# Patient Record
Sex: Female | Born: 1980
Health system: Southern US, Community
[De-identification: ages and names within clinical notes are randomized; demographics above are authoritative.]

## PROBLEM LIST (undated history)

## (undated) DIAGNOSIS — M1990 Unspecified osteoarthritis, unspecified site: Secondary | ICD-10-CM

## (undated) DIAGNOSIS — K219 Gastro-esophageal reflux disease without esophagitis: Secondary | ICD-10-CM

## (undated) DIAGNOSIS — K649 Unspecified hemorrhoids: Secondary | ICD-10-CM

## (undated) DIAGNOSIS — J301 Allergic rhinitis due to pollen: Secondary | ICD-10-CM

## (undated) DIAGNOSIS — T7840XA Allergy, unspecified, initial encounter: Secondary | ICD-10-CM

## (undated) DIAGNOSIS — Z87440 Personal history of urinary (tract) infections: Secondary | ICD-10-CM

## (undated) DIAGNOSIS — Z8669 Personal history of other diseases of the nervous system and sense organs: Secondary | ICD-10-CM

## (undated) DIAGNOSIS — R197 Diarrhea, unspecified: Secondary | ICD-10-CM

## (undated) DIAGNOSIS — K921 Melena: Secondary | ICD-10-CM

## (undated) DIAGNOSIS — K5900 Constipation, unspecified: Secondary | ICD-10-CM

## (undated) DIAGNOSIS — R87619 Unspecified abnormal cytological findings in specimens from cervix uteri: Secondary | ICD-10-CM

## (undated) HISTORY — DX: Unspecified osteoarthritis, unspecified site: M19.90

## (undated) HISTORY — PX: LYMPHADENECTOMY: SHX15

## (undated) HISTORY — DX: Constipation, unspecified: K59.00

## (undated) HISTORY — DX: Personal history of urinary (tract) infections: Z87.440

## (undated) HISTORY — DX: Unspecified hemorrhoids: K64.9

## (undated) HISTORY — DX: Melena: K92.1

## (undated) HISTORY — PX: FRACTURE SURGERY: SHX138

## (undated) HISTORY — DX: Allergy, unspecified, initial encounter: T78.40XA

## (undated) HISTORY — DX: Diarrhea, unspecified: R19.7

## (undated) HISTORY — DX: Personal history of other diseases of the nervous system and sense organs: Z86.69

## (undated) HISTORY — DX: Unspecified abnormal cytological findings in specimens from cervix uteri: R87.619

## (undated) HISTORY — DX: Gastro-esophageal reflux disease without esophagitis: K21.9

## (undated) HISTORY — DX: Allergic rhinitis due to pollen: J30.1

---

## 2013-10-22 DIAGNOSIS — M4180 Other forms of scoliosis, site unspecified: Secondary | ICD-10-CM

## 2013-10-22 HISTORY — DX: Other forms of scoliosis, site unspecified: M41.80

## 2014-10-22 DIAGNOSIS — M069 Rheumatoid arthritis, unspecified: Secondary | ICD-10-CM

## 2014-10-22 HISTORY — DX: Rheumatoid arthritis, unspecified: M06.9

## 2015-10-23 DIAGNOSIS — M5136 Other intervertebral disc degeneration, lumbar region: Secondary | ICD-10-CM

## 2015-10-23 DIAGNOSIS — M51369 Other intervertebral disc degeneration, lumbar region without mention of lumbar back pain or lower extremity pain: Secondary | ICD-10-CM

## 2015-10-23 HISTORY — DX: Other intervertebral disc degeneration, lumbar region without mention of lumbar back pain or lower extremity pain: M51.369

## 2015-10-23 HISTORY — DX: Other intervertebral disc degeneration, lumbar region: M51.36

## 2016-02-06 HISTORY — PX: OTHER SURGICAL HISTORY: SHX169

## 2020-10-07 ENCOUNTER — Telehealth: Payer: Self-pay | Admitting: Physician Assistant

## 2020-10-07 ENCOUNTER — Encounter: Payer: Self-pay | Admitting: Physician Assistant

## 2020-10-07 NOTE — Telephone Encounter (Signed)
Sent letter to patient telling her that appointment will need to be rescheduled due to provider not being in.

## 2020-10-26 ENCOUNTER — Ambulatory Visit: Payer: Self-pay | Admitting: Physician Assistant

## 2020-10-31 ENCOUNTER — Other Ambulatory Visit: Payer: Self-pay

## 2020-10-31 ENCOUNTER — Encounter: Payer: Self-pay | Admitting: Physician Assistant

## 2020-10-31 ENCOUNTER — Ambulatory Visit (INDEPENDENT_AMBULATORY_CARE_PROVIDER_SITE_OTHER): Payer: BC Managed Care – PPO | Admitting: Physician Assistant

## 2020-10-31 VITALS — BP 110/70 | HR 78 | Temp 98.2°F | Resp 16 | Ht 65.0 in | Wt 153.0 lb

## 2020-10-31 DIAGNOSIS — K219 Gastro-esophageal reflux disease without esophagitis: Secondary | ICD-10-CM | POA: Diagnosis not present

## 2020-10-31 DIAGNOSIS — R635 Abnormal weight gain: Secondary | ICD-10-CM

## 2020-10-31 DIAGNOSIS — M7711 Lateral epicondylitis, right elbow: Secondary | ICD-10-CM

## 2020-10-31 MED ORDER — OMEPRAZOLE 20 MG PO CPDR: 20.0000 mg | DELAYED_RELEASE_CAPSULE | Freq: Every day | ORAL | 3 refills | Status: DC

## 2020-10-31 NOTE — Progress Notes (Signed)
Patient presents to clinic today to establish care.  Acute Concerns: R elbow pain x 1+ years. Denies any noted trauma or injury but did work for the [employer], having to carry heavy items often. Describes pain as aching and sometimes throbbing. Notes there is a spot she can touch that intensifies the pain. Occasionally will make her hand and wrist tingle or go numb. .   Reflux -- daily heartburn, worse in the afternoon. Notes this has been occurring for a while.  Denies any noted major changes in diet but states she probably has eating out more than usual due to move and COVID. No nighttime awakenings. Denies cough. Notes occasional feeling of things getting stuck for a second. Has history of colonoscopy and EGD for reflux symptoms and stool changes. Notes having some fluctuation in stool from loose to harder. Denies melena, hematochezia or tenesmus. .   Weight -- mom, aunt and first cousing with hypothyroidism per patient report. Notes weight gain over the past few years, noting she is the biggest she has ever been. Does note bigger intake of food in the past several months but this   Chronic Issues: Rheumatoid Arthritis -- Diagnosed 6-7 years ago. Was previously on Methotrexate PO for 6-9 months without any improvement and noted side effects. No other options tried.  Will note occasional pain in her joints. Tries to keep active.    Past Medical History:  Diagnosis Date  . Arthritis    Rheumatoid    Past Surgical History:  Procedure Laterality Date  . CESAREAN SECTION     X2  . FRACTURE SURGERY      Current Outpatient Medications on File Prior to Visit  Medication Sig Dispense Refill  . Ascorbic Acid (VITAMIN C WITH ROSE HIPS) 500 MG tablet Take 500 mg by mouth daily.    . Cholecalciferol (VITAMIN D) 50 MCG (2000 UT) CAPS Take 1 capsule by mouth daily.    Marland Kitchen levonorgestrel (MIRENA) 20 MCG/24HR IUD 1 each by Intrauterine route once.     No current facility-administered  medications on file prior to visit.    Allergies  Allergen Reactions  . Bactrim Ds [Sulfamethoxazole-Trimethoprim] Anaphylaxis and Rash    Family History  Problem Relation Age of Onset  . Rheum arthritis Mother   . Fibromyalgia Mother   . Arthritis Father   . Drug abuse Maternal Grandmother   . Heart attack Maternal Grandmother     Social History   Socioeconomic History  . Marital status: Single    Spouse name: Not on file  . Number of children: Not on file  . Years of education: Not on file  . Highest education level: Not on file  Occupational History  . Not on file  Tobacco Use  . Smoking status: Former Games developer  . Smokeless tobacco: Never Used  Vaping Use  . Vaping Use: Never used  Substance and Sexual Activity  . Alcohol use: Yes    Alcohol/week: 6.0 standard drinks    Types: 6 Cans of beer per week  . Drug use: Never  . Sexual activity: Yes    Birth control/protection: I.U.D.  Other Topics Concern  . Not on file  Social History Narrative  . Not on file   Social Determinants of Health   Financial Resource Strain: Not on file  Food Insecurity: Not on file  Transportation Needs: Not on file  Physical Activity: Not on file  Stress: Not on file  Social Connections: Not on file  Intimate  Partner Violence: Not on file   ROS Pertinent ROS are listed in the HPI.   Resp 16   Ht 5\' 5"  (1.651 m)   Wt 153 lb (69.4 kg)   BMI 25.46 kg/m   Physical Exam  Assessment/Plan: 1. Gastroesophageal reflux disease without esophagitis Will check labs today. Dietary recommendations reviewed with patient. Handout given. Start trial of Prilosec 20 mg daily. Follow-up discussed.  - Comprehensive metabolic panel - omeprazole (PRILOSEC) 20 MG capsule; Take 1 capsule (20 mg total) by mouth daily.  Dispense: 30 capsule; Refill: 3  2. Weight gain Will check CMP and TSH level today to make sure non-contributory. Dietary and exercise recommendations reviewed.  - Comprehensive  metabolic panel - TSH  3. Lateral epicondylitis of right elbow Wants to avoid oral NSAIDs if possible due to GERD. Will have her start OTC turmeric and topical Voltaren. Elevation, ice and bracing discussed. If no substantial improvement will refer to Sports Medicine.  This visit occurred during the SARS-CoV-2 public health emergency.  Safety protocols were in place, including screening questions prior to the visit, additional usage of staff PPE, and extensive cleaning of exam room while observing appropriate contact time as indicated for disinfecting solutions.     , PA-C

## 2020-10-31 NOTE — Patient Instructions (Signed)
Please go to the lab today for blood work.  I will call you with your results. We will alter treatment regimen(s) if indicated by your results.   For reflux, please start the dietary recommendations below.  Take the Omeprazole once daily for 2 weeks. Let's follow-up in 2 weeks for reassessment.  Sooner if needed. Our next step will be to set up a GI evaluation to make sure there is no esophageal stricture.   For the elbow, ok to take an OTC Turmeric. Apply topical OTC Voltaren gel to the area.  Elevate while resting. If no substantial improvement we need to consider imaging versus Sports Medicine assessment.   I am getting you set up with a new Rheumatologist.   For weight, we are checking thyroid levels today. I do want you to look into working on low-impact exercise like yoga, tai chi, swimming or water aerobics.  I will call with results and next steps.    Food Choices for Gastroesophageal Reflux Disease, Adult When you have gastroesophageal reflux disease (GERD), the foods you eat and your eating habits are very important. Choosing the right foods can help ease your discomfort. Think about working with a food expert (dietitian) to help you make good choices. What are tips for following this plan? Reading food labels  Look for foods that are low in saturated fat. Foods that may help with your symptoms include: ? Foods that have less than 5% of daily value (DV) of fat. ? Foods that have 0 grams of trans fat. Cooking  Do not fry your food.  Cook your food by baking, steaming, grilling, or broiling. These are all methods that do not need a lot of fat for cooking.  To add flavor, try to use herbs that are low in spice and acidity. Meal planning  Choose healthy foods that are low in fat, such as: ? Fruits and vegetables. ? Whole grains. ? Low-fat dairy products. ? Lean meats, fish, and poultry.  Eat small meals often instead of eating 3 large meals each day. Eat your meals  slowly in a place where you are relaxed. Avoid bending over or lying down until 2-3 hours after eating.  Limit high-fat foods such as fatty meats or fried foods.  Limit your intake of fatty foods, such as oils, butter, and shortening.  Avoid the following as told by your doctor: ? Foods that cause symptoms. These may be different for different people. Keep a food diary to keep track of foods that cause symptoms. ? Alcohol. ? Drinking a lot of liquid with meals. ? Eating meals during the 2-3 hours before bed.   Lifestyle  Stay at a healthy weight. Ask your doctor what weight is healthy for you. If you need to lose weight, work with your doctor to do so safely.  Exercise for at least 30 minutes on 5 or more days each week, or as told by your doctor.  Wear loose-fitting clothes.  Do not smoke or use any products that contain nicotine or tobacco. If you need help quitting, ask your doctor.  Sleep with the head of your bed higher than your feet. Use a wedge under the mattress or blocks under the bed frame to raise the head of the bed.  Chew sugar-free gum after meals. What foods should eat? Eat a healthy, well-balanced diet of fruits, vegetables, whole grains, low-fat dairy products, lean meats, fish, and poultry. Each person is different. Foods that may cause symptoms in one person may  not cause any symptoms in another person. Work with your doctor to find foods that are safe for you. The items listed above may not be a complete list of what you can eat and drink. Contact a food expert for more options.   What foods should I avoid? Limiting some of these foods may help in managing the symptoms of GERD. Everyone is different. Talk with a food expert or your doctor to help you find the exact foods to avoid, if any. Fruits Any fruits prepared with added fat. Any fruits that cause symptoms. For some people, this may include citrus fruits, such as oranges, grapefruit, pineapple, and  lemons. Vegetables Deep-fried vegetables. [place] fries. Any vegetables prepared with added fat. Any vegetables that cause symptoms. For some people, this may include tomatoes and tomato products, chili peppers, onions and garlic, and horseradish. Grains Pastries or quick breads with added fat. Meats and other proteins High-fat meats, such as fatty beef or pork, hot dogs, ribs, ham, sausage, salami, and bacon. Fried meat or protein, including fried fish and fried chicken. Nuts and nut butters, in large amounts. Dairy Whole milk and chocolate milk. Sour cream. Cream. Ice cream. Cream cheese. Milkshakes. Fats and oils Butter. Margarine. Shortening. Ghee. Beverages Coffee and tea, with or without caffeine. Carbonated beverages. Sodas. Energy drinks. Fruit juice made with acidic fruits, such as orange or grapefruit. Tomato juice. Alcoholic drinks. Sweets and desserts Chocolate and cocoa. Donuts. Seasonings and condiments Pepper. Peppermint and spearmint. Added salt. Any condiments, herbs, or seasonings that cause symptoms. For some people, this may include curry, hot sauce, or vinegar-based salad dressings. The items listed above may not be a complete list of what you should not eat and drink. Contact a food expert for more options. Questions to ask your doctor Diet and lifestyle changes are often the first steps that are taken to manage symptoms of GERD. If diet and lifestyle changes do not help, talk with your doctor about taking medicines. Where to find more information  International Foundation for Gastrointestinal Disorders: aboutgerd.org Summary  When you have GERD, food and lifestyle choices are very important in easing your symptoms.  Eat small meals often instead of 3 large meals a day. Eat your meals slowly and in a place where you are relaxed.  Avoid bending over or lying down until 2-3 hours after eating.  Limit high-fat foods such as fatty meats or fried foods. This  information is not intended to replace advice given to you by your health care provider. Make sure you discuss any questions you have with your health care provider. Document Revised: 04/18/2020 Document Reviewed: 04/18/2020 Elsevier Patient Education  [place].

## 2020-11-01 LAB — TSH: TSH: 1.77 u[IU]/mL (ref 0.35–4.50)

## 2020-11-01 LAB — COMPREHENSIVE METABOLIC PANEL
ALT: 17 U/L (ref 0–35)
AST: 17 U/L (ref 0–37)
Albumin: 4.6 g/dL (ref 3.5–5.2)
Alkaline Phosphatase: 52 U/L (ref 39–117)
BUN: 10 mg/dL (ref 6–23)
CO2: 26 mEq/L (ref 19–32)
Calcium: 8.9 mg/dL (ref 8.4–10.5)
Chloride: 103 mEq/L (ref 96–112)
Creatinine, Ser: 0.67 mg/dL (ref 0.40–1.20)
GFR: 108.29 mL/min (ref >60.00)
Glucose, Bld: 80 mg/dL (ref 70–99)
Potassium: 3.7 mEq/L (ref 3.5–5.1)
Sodium: 134 mEq/L — ABNORMAL LOW (ref 135–145)
Total Bilirubin: 0.3 mg/dL (ref 0.2–1.2)
Total Protein: 8 g/dL (ref 6.0–8.3)

## 2020-11-02 ENCOUNTER — Other Ambulatory Visit: Payer: Self-pay | Admitting: Emergency Medicine

## 2020-11-02 DIAGNOSIS — Z8739 Personal history of other diseases of the musculoskeletal system and connective tissue: Secondary | ICD-10-CM

## 2020-12-07 ENCOUNTER — Encounter: Payer: Self-pay | Admitting: Physician Assistant

## 2021-02-10 ENCOUNTER — Other Ambulatory Visit: Payer: Self-pay

## 2021-02-13 ENCOUNTER — Ambulatory Visit: Payer: BC Managed Care – PPO | Admitting: Family Medicine

## 2021-02-13 ENCOUNTER — Other Ambulatory Visit: Payer: Self-pay

## 2021-02-13 ENCOUNTER — Encounter: Payer: Self-pay | Admitting: Family Medicine

## 2021-02-13 VITALS — BP 111/70 | HR 73 | Temp 98.5°F | Ht 64.5 in | Wt 159.0 lb

## 2021-02-13 DIAGNOSIS — M069 Rheumatoid arthritis, unspecified: Secondary | ICD-10-CM

## 2021-02-13 DIAGNOSIS — N921 Excessive and frequent menstruation with irregular cycle: Secondary | ICD-10-CM | POA: Diagnosis not present

## 2021-02-13 DIAGNOSIS — Z0000 Encounter for general adult medical examination without abnormal findings: Secondary | ICD-10-CM

## 2021-02-13 DIAGNOSIS — J302 Other seasonal allergic rhinitis: Secondary | ICD-10-CM

## 2021-02-13 MED ORDER — FLUTICASONE PROPIONATE 50 MCG/ACT NA SUSP: 2.0000 | Freq: Every day | NASAL | 6 refills | Status: DC

## 2021-02-13 MED ORDER — CETIRIZINE HCL 10 MG PO TABS: 10.0000 mg | ORAL_TABLET | Freq: Every day | ORAL | 1 refills | Status: DC

## 2021-02-13 MED ORDER — DICLOFENAC SODIUM 75 MG PO TBEC: 75.0000 mg | DELAYED_RELEASE_TABLET | Freq: Two times a day (BID) | ORAL | 0 refills | Status: DC

## 2021-02-13 NOTE — Patient Instructions (Signed)
  Great to see you today.    If labs were collected, we will inform you of lab results once received either by echart message or telephone call.   - echart message- for normal results that have been seen by the patient already.   - telephone call: abnormal results or if patient has not viewed results in their echart.  

## 2021-02-13 NOTE — Progress Notes (Signed)
Patient ID: Crystal Hutchinson, female  DOB: 1981/06/15, 40 y.o.   MRN: [id number] Patient Care Team    Relationship Specialty Notifications Start End  Natalia Leatherwood, DO PCP - General Family Medicine  02/13/21     Chief Complaint  Patient presents with  . Establish Care    Pt would like referrals;   . Nasal Congestion    Pt c/o nasal congestion, eye irritation, trouble breath x 5-6 years    Subjective:  Crystal Hutchinson is a 40 y.o.  female present for new patient establishment. All past medical history, surgical history, allergies, family history, immunizations, medications and social history were updated in the electronic medical record today. All recent labs, ED visits and hospitalizations within the last year were reviewed.  Allergies: Patient reports she has had worsening allergies over the last 5 to 6 years.  She has rather significant nasal congestion, eye irritation and occasional postnasal drip.  She is currently taking over-the-counter loratadine only.  Rheumatoid arthritis: Patient reports she was diagnosed with rheumatoid arthritis when she was approximately 40 years old.  The majority of her discomfort is in bilateral hands and right elbow.  She had been following with a rheumatologist in [place].  At one time she had been started on methotrexate, and despite increasing doses her rheumatoid was never improved.  She states since that time her pain is okay, but it does prevent her from exercising as she would like.  She does take turmeric on occasions to help with discomfort.  Abnormal menses: Patient has had a Mirena IUD placement in 2019.  She states she has had continuous menses since that time.  She states there are a few days out of the month in which she has no bleeding, but the majority of the day she has 2 days of heavy bleeding and then a couple weeks of light to moderate bleeding daily.   Depression screen [facility] 2/9 10/31/2020  Decreased Interest 0  Down, Depressed,  Hopeless 0  PHQ - 2 Score 0  Altered sleeping 0  Tired, decreased energy 0  Change in appetite 0  Feeling bad or failure about yourself  0  Trouble concentrating 0  Moving slowly or fidgety/restless 0  Suicidal thoughts 0  PHQ-9 Score 0  Difficult doing work/chores Not difficult at all   No flowsheet data found.       Fall Risk  10/31/2020  Falls in the past year? 0  Number falls in past yr: 0  Injury with Fall? 0  Follow up Falls evaluation completed     Immunization History  Administered Date(s) Administered  . Influenza-Unspecified 08/22/2020  . PFIZER(Purple Top)SARS-COV-2 Vaccination 01/28/2020, 02/19/2020    No exam data present  Past Medical History:  Diagnosis Date  . Abnormal Pap smear of cervix   . Allergy   . Arthritis    Rheumatoid  . Blood in stool   . History of UTI   . Hx of migraines    Allergies  Allergen Reactions  . Bactrim Ds [Sulfamethoxazole-Trimethoprim] Anaphylaxis and Rash   Past Surgical History:  Procedure Laterality Date  . CESAREAN SECTION  2009, 2012   X2  . FRACTURE SURGERY    . LYMPHADENECTOMY     Family History  Problem Relation Age of Onset  . Rheum arthritis Mother        Significant  . Fibromyalgia Mother   . Arthritis Father   . Heart attack Maternal Grandmother 93  . Diabetes Maternal  Grandmother   . Healthy Brother   . Colon cancer Neg Hx   . Colon polyps Neg Hx    Social History   Social History Narrative   Marital status/children/pets: single.    Education/employment: Employed, education and occupation questionnaire  not completed by pt.     Safety:      -Wears a bicycle helmet riding a bike: Yes     -smoke alarm in the home:Yes     - wears seatbelt: Yes     - Feels safe in their relationships: Yes    Allergies as of 02/13/2021      Reactions   Bactrim Ds [sulfamethoxazole-trimethoprim] Anaphylaxis, Rash      Medication List       Accurate as of February 13, 2021  5:06 PM. If you have any  questions, ask your nurse or doctor.        cetirizine 10 MG tablet Commonly known as: ZYRTEC Take 1 tablet (10 mg total) by mouth daily. Started by: Felix Pacini, DO   diclofenac 75 MG EC tablet Commonly known as: VOLTAREN Take 1 tablet (75 mg total) by mouth 2 (two) times daily. Started by: Felix Pacini, DO   fluticasone 50 MCG/ACT nasal spray Commonly known as: FLONASE Place 2 sprays into both nostrils daily. Started by: Felix Pacini, DO   levonorgestrel 20 MCG/24HR IUD Commonly known as: MIRENA 1 each by Intrauterine route once.   omeprazole 20 MG capsule Commonly known as: PRILOSEC Take 1 capsule (20 mg total) by mouth daily.   vitamin C with rose hips 500 MG tablet Take 500 mg by mouth daily.   Vitamin D 50 MCG (2000 UT) Caps Take 1 capsule by mouth daily.      All past medical history, surgical history, allergies, family history, immunizations andmedications were updated in the EMR today and reviewed under the history and medication portions of their EMR.    No results found for this or any previous visit (from the past 2160 hour(s)).  Patient was never admitted.  ROS: 14 pt review of systems performed and negative (unless mentioned in an HPI)  Objective: BP 111/70   Pulse 73   Temp 98.5 F (36.9 C) (Oral)   Ht 5' 4.5" (1.638 m)   Wt 159 lb (72.1 kg)   SpO2 99%   BMI 26.87 kg/m  Gen: Afebrile. No acute distress. Nontoxic in appearance, well-developed, well-nourished, pleasant female HENT: AT. [place]. Bilateral TM visualized and normal in appearance-with mild bilateral effusions, normal external auditory canal. MMM, no oral lesions, adequate dentition. Bilateral nares without erythema or drainage.  Swelling present.  Throat without erythema, ulcerations or exudates.  No cough on exam, no hoarseness on exam.  Small nasopharynx and enlarged tonsils present. Eyes:Pupils Equal Round Reactive to light, Extraocular movements intact,  Conjunctiva without redness,  discharge or icterus. Neck/lymp/endocrine: Supple, no lymphadenopathy CV: RRR Chest: CTAB Skin: No rashes, purpura or petechiae. Warm and well-perfused. Skin intact. Neuro/Msk:  Normal gait. PERLA. EOMi. Alert. Oriented x3. Psych: Normal affect, dress and demeanor. Normal speech. Normal thought content and judgment.  Assessment/plan: Aprile Dickenson is a 40 y.o. female present for establish care/transfer care Menorrhagia with irregular cycle ensure she has not become anemic or iron deficient given her reports of rather continuous bleeding pattern majority days out of the month since 2019.  Referral to gynecology to further evaluate menometrorrhagia and IUD management. - Ambulatory referral to Obstetrics / Gynecology - CBC w/Diff - Iron, TIBC and Ferritin Panel -  TSH  Rheumatoid arthritis, involving unspecified site, unspecified whether rheumatoid factor present [facility]) Seems her prior PCP and patient has been actively attempting to obtain her rheumatology records from [place] and has been unsuccessful.  Will attempt again today, otherwise will either need to contact rheumatology to see her without records and/or repeat labs to verify rheumatoid arthritis if that is what is needed.  Otherwise, we may need to see her without records or we can refer to another rheumatologist. In the meantime, start Voltaren 1 tab twice daily with meals. - diclofenac (VOLTAREN) 75 MG EC tablet; Take 1 tablet (75 mg total) by mouth 2 (two) times daily.  Dispense: 30 tablet; Refill: 0 - Ambulatory referral to Rheumatology  Seasonal allergies Significant allergies.  Discussed management today. DC loratadine Start Zyrtec nightly Start Flonase 2 sprays twice daily Follow-up in 3 months, sooner if needed or symptoms are not well controlled.  No follow-ups on file.  Orders Placed This Encounter  Procedures  . CBC w/Diff  . Iron, TIBC and Ferritin Panel  . TSH  . Ambulatory referral to Obstetrics / Gynecology  .  Ambulatory referral to Rheumatology   Meds ordered this encounter  Medications  . diclofenac (VOLTAREN) 75 MG EC tablet    Sig: Take 1 tablet (75 mg total) by mouth 2 (two) times daily.    Dispense:  30 tablet    Refill:  0  . fluticasone (FLONASE) 50 MCG/ACT nasal spray    Sig: Place 2 sprays into both nostrils daily.    Dispense:  16 g    Refill:  6  . cetirizine (ZYRTEC) 10 MG tablet    Sig: Take 1 tablet (10 mg total) by mouth daily.    Dispense:  90 tablet    Refill:  1    Referral Orders     Ambulatory referral to Obstetrics / Gynecology     Ambulatory referral to Rheumatology   Note is dictated utilizing voice recognition software. Although note has been proof read prior to signing, occasional typographical errors still can be missed. If any questions arise, please do not hesitate to call for verification.  Electronically signed by: Felix Pacini, DO [facility]

## 2021-02-14 ENCOUNTER — Telehealth: Payer: Self-pay | Admitting: Family Medicine

## 2021-02-14 LAB — CBC WITH DIFFERENTIAL/PLATELET
Basophils Absolute: 0 10*3/uL (ref 0.0–0.1)
Basophils Relative: 1 % (ref 0.0–3.0)
Eosinophils Absolute: 0.1 10*3/uL (ref 0.0–0.7)
Eosinophils Relative: 3.5 % (ref 0.0–5.0)
HCT: 35.5 % — ABNORMAL LOW (ref 36.0–46.0)
Hemoglobin: 12.4 g/dL (ref 12.0–15.0)
Lymphocytes Relative: 35.8 % (ref 12.0–46.0)
Lymphs Abs: 1.2 10*3/uL (ref 0.7–4.0)
MCHC: 34.8 g/dL (ref 30.0–36.0)
MCV: 88.7 fl (ref 78.0–100.0)
Monocytes Absolute: 0.3 10*3/uL (ref 0.1–1.0)
Monocytes Relative: 9.5 % (ref 3.0–12.0)
Neutro Abs: 1.6 10*3/uL (ref 1.4–7.7)
Neutrophils Relative %: 50.2 % (ref 43.0–77.0)
Platelets: 253 10*3/uL (ref 150.0–400.0)
RBC: 4.01 Mil/uL (ref 3.87–5.11)
RDW: 12.6 % (ref 11.5–15.5)
WBC: 3.2 10*3/uL — ABNORMAL LOW (ref 4.0–10.5)

## 2021-02-14 LAB — IRON,TIBC AND FERRITIN PANEL
%SAT: 26 % (calc) (ref 16–45)
Ferritin: 43 ng/mL (ref 16–232)
Iron: 87 ug/dL (ref 40–190)
TIBC: 338 mcg/dL (calc) (ref 250–450)

## 2021-02-14 LAB — TSH: TSH: 1.78 u[IU]/mL (ref 0.35–4.50)

## 2021-02-14 NOTE — Telephone Encounter (Signed)
Please inform patient: Her iron panel is normal. Hemoglobin is normal Thyroid levels are also normal.

## 2021-02-14 NOTE — Telephone Encounter (Signed)
Spoke with pt regarding labs and instructions.   

## 2021-02-27 ENCOUNTER — Encounter: Payer: Self-pay | Admitting: Family Medicine

## 2021-02-27 ENCOUNTER — Telehealth: Payer: Self-pay | Admitting: Family Medicine

## 2021-02-27 NOTE — Telephone Encounter (Signed)
Please inform patient we have received her records from rheumatology in [place].  I have added this to her history and we will scanned information into the chart for her.

## 2021-02-28 ENCOUNTER — Encounter: Payer: Self-pay | Admitting: Family Medicine

## 2021-02-28 NOTE — Telephone Encounter (Signed)
There are many different reason she could be having no symptoms without evaluating her to really difficult for me to give her advice.  If she has not had a good bowel movement she could be "backed up" and this could be the cause of her symptoms.  In that event a suppository would help and she should try using that or an enema to have a bowel movement.  If symptoms worsening, she is unable to take in any thing by mouth, she has abdominal pain or rectal bleeding I would encourage her to be seen immediately in the emergency room.  Otherwise, we will see her tomorrow.

## 2021-03-01 ENCOUNTER — Other Ambulatory Visit: Payer: Self-pay

## 2021-03-01 ENCOUNTER — Encounter: Payer: Self-pay | Admitting: Family Medicine

## 2021-03-01 ENCOUNTER — Ambulatory Visit: Payer: BC Managed Care – PPO | Admitting: Family Medicine

## 2021-03-01 VITALS — BP 121/81 | HR 84 | Temp 98.8°F | Wt 157.0 lb

## 2021-03-01 DIAGNOSIS — R1110 Vomiting, unspecified: Secondary | ICD-10-CM

## 2021-03-01 DIAGNOSIS — R1013 Epigastric pain: Secondary | ICD-10-CM

## 2021-03-01 DIAGNOSIS — R194 Change in bowel habit: Secondary | ICD-10-CM | POA: Diagnosis not present

## 2021-03-01 DIAGNOSIS — R112 Nausea with vomiting, unspecified: Secondary | ICD-10-CM

## 2021-03-01 HISTORY — DX: Epigastric pain: R10.13

## 2021-03-01 HISTORY — DX: Vomiting, unspecified: R11.10

## 2021-03-01 MED ORDER — SENNOSIDES 8.6 MG PO TABS: 1.0000 | ORAL_TABLET | Freq: Every day | ORAL | 5 refills | Status: DC

## 2021-03-01 NOTE — Patient Instructions (Signed)
Start senokot 1-2 tabs nightly.  You can taper miralax in 8 ounces of fluid daily 1/4-1 cap daily.   We will call you with lab results.

## 2021-03-01 NOTE — Progress Notes (Signed)
This visit occurred during the SARS-CoV-2 public health emergency.  Safety protocols were in place, including screening questions prior to the visit, additional usage of staff PPE, and extensive cleaning of exam room while observing appropriate contact time as indicated for disinfecting solutions.    Crystal Hutchinson , 03-17-1981, 40 y.o., female MRN: [id number] Patient Care Team    Relationship Specialty Notifications Start End  Ma Hillock, DO PCP - General Family Medicine  02/13/21     Chief Complaint  Patient presents with  . Constipation    Pt c/o constipation that has now resolved, n/v, chest pain, epigastric pain described as cramping x 2 days;      Subjective: Pt presents for an OV with complaints of constipation, nausea, vomit, cramping, epigastric pain of 2 days. She called in yesterday for advise and was advised to try a suppository to help with constipation. She states she  Did take ducolax (oral)and that  helped resolve her constipation.   She denies ferv or chills. She denies melena. She tolerating PO today.  She reports this happened once prior with BBQ pork then as well she got the pork stuck in her esophagus (points to mis chest) and vomited it back up. She has had both an egd and colonoscopy in the past- pt reports they were normal and were completed for abd pain.  Depression screen [facility] 2/9 10/31/2020  Decreased Interest 0  Down, Depressed, Hopeless 0  PHQ - 2 Score 0  Altered sleeping 0  Tired, decreased energy 0  Change in appetite 0  Feeling bad or failure about yourself  0  Trouble concentrating 0  Moving slowly or fidgety/restless 0  Suicidal thoughts 0  PHQ-9 Score 0  Difficult doing work/chores Not difficult at all    Allergies  Allergen Reactions  . Bactrim Ds [Sulfamethoxazole-Trimethoprim] Anaphylaxis and Rash   Social History   Social History Narrative   Marital status/children/pets: single.    Education/employment: Employed, education and  occupation questionnaire  not completed by pt.     Safety:      -Wears a bicycle helmet riding a bike: Yes     -smoke alarm in the home:Yes     - wears seatbelt: Yes     - Feels safe in their relationships: Yes   Past Medical History:  Diagnosis Date  . Abnormal Pap smear of cervix   . Allergy   . Blood in stool   . DDD (degenerative disc disease), lumbar 2017   L4-L5 minimal retrolisthesis with shallow, noncompressive disc displacement or disc bulge.  L5-S1 degenerative disc disease broad-based left paracentral disc protrusion noted disc herniation with minimal caudal migration and associated peripheral annular tear with abutment of the ventral medial aspect of the proximal left descending S1 nerve root  . History of UTI   . Hx of migraines   . Levoscoliosis 2015   "mild"  . Rheumatoid arthritis ([place]) 2016   positive ANA 1: 10240 and rheumatoid factor  160.   Past Surgical History:  Procedure Laterality Date  . CESAREAN SECTION  2009, 2012   X2  . FRACTURE SURGERY    . LYMPHADENECTOMY    . Procedure: Left L5-S1 transforaminal epidural steroid injection with fluoroscopy  02/06/2016   Family History  Problem Relation Age of Onset  . Rheum arthritis Mother        Significant  . Fibromyalgia Mother   . Arthritis Father   . Heart attack Maternal Grandmother 93  .  Diabetes Maternal Grandmother   . Healthy Brother   . Colon cancer Neg Hx   . Colon polyps Neg Hx    Allergies as of 03/01/2021      Reactions   Bactrim Ds [sulfamethoxazole-trimethoprim] Anaphylaxis, Rash      Medication List       Accurate as of Mar 01, 2021  2:33 PM. If you have any questions, ask your nurse or doctor.        cetirizine 10 MG tablet Commonly known as: ZYRTEC Take 1 tablet (10 mg total) by mouth daily.   diclofenac 75 MG EC tablet Commonly known as: VOLTAREN Take 1 tablet (75 mg total) by mouth 2 (two) times daily.   fluticasone 50 MCG/ACT nasal spray Commonly known as:  FLONASE Place 2 sprays into both nostrils daily.   levonorgestrel 20 MCG/24HR IUD Commonly known as: MIRENA 1 each by Intrauterine route once.   omeprazole 20 MG capsule Commonly known as: PRILOSEC Take 1 capsule (20 mg total) by mouth daily.   vitamin C with rose hips 500 MG tablet Take 500 mg by mouth daily.   Vitamin D 50 MCG (2000 UT) Caps Take 1 capsule by mouth daily.       All past medical history, surgical history, allergies, family history, immunizations andmedications were updated in the EMR today and reviewed under the history and medication portions of their EMR.     ROS: Negative, with the exception of above mentioned in HPI   Objective:  BP 121/81   Pulse 84   Temp 98.8 F (37.1 C) (Oral)   Wt 157 lb (71.2 kg)   SpO2 98%   BMI 26.53 kg/m  Body mass index is 26.53 kg/m. Gen: Afebrile. No acute distress. Nontoxic in appearance, well developed, well nourished.  HENT: AT. [place].  Eyes:Pupils Equal Round Reactive to light, Extraocular movements intact,  Conjunctiva without redness, discharge or icterus. Neck/lymp/endocrine: Supple,no lymphadenopathy CV: RRR Chest: CTAB, no wheeze or crackles. Good air movement, normal resp effort.  Abd: Soft. Flat. TTP LUQ & mid RQ. ND. BS present. no Masses palpated. No rebound. Mild  guarding. Skin: no rashes, purpura or petechiae.  Neuro:  Normal gait. PERLA. EOMi. Alert. Oriented x3  Psych: Normal affect, dress and demeanor. Normal speech. Normal thought content and judgment.  No exam data present No results found. No results found for this or any previous visit (from the past 24 hour(s)).  Assessment/Plan: Crystal Hutchinson is a 40 y.o. female present for OV for  Epigastric pain/nausea-vomit/regurg food/bowel habit changes She is tender over epigastric and pancreas today. She had an episode of regurg of pulled pork that seemed to initiate all the sx, but has tolerated PO since and had a BM with assistance of ducolax.  She  has a h/o of RA- could be a crossover AA process given she describes frequent pain mid- right quadrant (? Meckel's)  and slow bowels.  Will check liver fx and lipase today.  Start bowel regimen of senakot qhs. +/- miralax taper.  - est bowel regimen/time.  - Comp Met (CMET) - Lipase - if symptoms persist would consider swallow study +/- GI referral     Reviewed expectations re: course of current medical issues.  Discussed self-management of symptoms.  Outlined signs and symptoms indicating need for more acute intervention.  Patient verbalized understanding and all questions were answered.  Patient received an After-Visit Summary.    No orders of the defined types were placed in this encounter.  No orders of the defined types were placed in this encounter.  Referral Orders  No referral(s) requested today     Note is dictated utilizing voice recognition software. Although note has been proof read prior to signing, occasional typographical errors still can be missed. If any questions arise, please do not hesitate to call for verification.   electronically signed by:  Howard Pouch, DO  [place]

## 2021-03-02 LAB — COMPREHENSIVE METABOLIC PANEL
ALT: 19 U/L (ref 0–35)
AST: 17 U/L (ref 0–37)
Albumin: 5 g/dL (ref 3.5–5.2)
Alkaline Phosphatase: 59 U/L (ref 39–117)
BUN: 12 mg/dL (ref 6–23)
CO2: 28 mEq/L (ref 19–32)
Calcium: 9.4 mg/dL (ref 8.4–10.5)
Chloride: 103 mEq/L (ref 96–112)
Creatinine, Ser: 0.77 mg/dL (ref 0.40–1.20)
GFR: 95.35 mL/min (ref >60.00)
Glucose, Bld: 90 mg/dL (ref 70–99)
Potassium: 4.5 mEq/L (ref 3.5–5.1)
Sodium: 136 mEq/L (ref 135–145)
Total Bilirubin: 0.4 mg/dL (ref 0.2–1.2)
Total Protein: 8.6 g/dL — ABNORMAL HIGH (ref 6.0–8.3)

## 2021-03-02 LAB — LIPASE: Lipase: 42 U/L (ref 11.0–59.0)

## 2021-03-13 DIAGNOSIS — R5383 Other fatigue: Secondary | ICD-10-CM | POA: Diagnosis not present

## 2021-03-13 DIAGNOSIS — M3500 Sicca syndrome, unspecified: Secondary | ICD-10-CM | POA: Diagnosis not present

## 2021-03-13 DIAGNOSIS — M2550 Pain in unspecified joint: Secondary | ICD-10-CM | POA: Diagnosis not present

## 2021-03-13 DIAGNOSIS — M7989 Other specified soft tissue disorders: Secondary | ICD-10-CM | POA: Diagnosis not present

## 2021-03-13 DIAGNOSIS — Z111 Encounter for screening for respiratory tuberculosis: Secondary | ICD-10-CM | POA: Diagnosis not present

## 2021-03-13 DIAGNOSIS — M0579 Rheumatoid arthritis with rheumatoid factor of multiple sites without organ or systems involvement: Secondary | ICD-10-CM | POA: Diagnosis not present

## 2021-03-13 LAB — CBC AND DIFFERENTIAL
HCT: 37 (ref 36–46)
Hemoglobin: 12.6 (ref 12.0–16.0)
Neutrophils Absolute: 1.9
Platelets: 251 (ref 150–399)
WBC: 3.5

## 2021-03-13 LAB — POCT ERYTHROCYTE SEDIMENTATION RATE, NON-AUTOMATED: Sed Rate: 4

## 2021-03-13 LAB — BASIC METABOLIC PANEL
BUN: 7 (ref 4–21)
CO2: 22 (ref 13–22)
Chloride: 100 (ref 99–108)
Creatinine: 0.7 (ref 0.5–1.1)
Glucose: 79
Potassium: 3.5 (ref 3.4–5.3)
Sodium: 134 — AB (ref 137–147)

## 2021-03-13 LAB — COMPREHENSIVE METABOLIC PANEL
Albumin: 4.8 (ref 3.5–5.0)
Calcium: 9.1 (ref 8.7–10.7)
GFR calc non Af Amer: 109

## 2021-03-13 LAB — HEPATIC FUNCTION PANEL
ALT: 20 (ref 7–35)
AST: 17 (ref 13–35)
Alkaline Phosphatase: 64 (ref 25–125)
Bilirubin, Total: 0.4

## 2021-03-13 LAB — CBC: RBC: 4.09 (ref 3.87–5.11)

## 2021-03-27 ENCOUNTER — Encounter: Payer: BC Managed Care – PPO | Admitting: Family Medicine

## 2021-03-28 LAB — C-REACTIVE PROTEIN: CRP: <1

## 2021-03-29 DIAGNOSIS — M3500 Sicca syndrome, unspecified: Secondary | ICD-10-CM | POA: Diagnosis not present

## 2021-03-29 DIAGNOSIS — M0579 Rheumatoid arthritis with rheumatoid factor of multiple sites without organ or systems involvement: Secondary | ICD-10-CM | POA: Diagnosis not present

## 2021-03-30 ENCOUNTER — Other Ambulatory Visit: Payer: Self-pay

## 2021-03-30 ENCOUNTER — Encounter: Payer: Self-pay | Admitting: Obstetrics and Gynecology

## 2021-03-30 ENCOUNTER — Other Ambulatory Visit (HOSPITAL_COMMUNITY)
Admission: RE | Admit: 2021-03-30 | Discharge: 2021-03-30 | Disposition: A | Discharge status: Home / Self Care | Payer: BC Managed Care – PPO | Source: Ambulatory Visit | Attending: Obstetrics and Gynecology | Admitting: Obstetrics and Gynecology

## 2021-03-30 ENCOUNTER — Ambulatory Visit (INDEPENDENT_AMBULATORY_CARE_PROVIDER_SITE_OTHER): Payer: BC Managed Care – PPO | Admitting: Obstetrics and Gynecology

## 2021-03-30 VITALS — BP 121/91 | HR 66 | Wt 157.8 lb

## 2021-03-30 DIAGNOSIS — Z01419 Encounter for gynecological examination (general) (routine) without abnormal findings: Secondary | ICD-10-CM | POA: Insufficient documentation

## 2021-03-30 DIAGNOSIS — N750 Cyst of Bartholin's gland: Secondary | ICD-10-CM

## 2021-03-30 DIAGNOSIS — Z30432 Encounter for removal of intrauterine contraceptive device: Secondary | ICD-10-CM | POA: Insufficient documentation

## 2021-03-30 DIAGNOSIS — Z124 Encounter for screening for malignant neoplasm of cervix: Secondary | ICD-10-CM

## 2021-03-30 DIAGNOSIS — Z1231 Encounter for screening mammogram for malignant neoplasm of breast: Secondary | ICD-10-CM

## 2021-03-30 DIAGNOSIS — N921 Excessive and frequent menstruation with irregular cycle: Secondary | ICD-10-CM | POA: Insufficient documentation

## 2021-03-30 DIAGNOSIS — Z975 Presence of (intrauterine) contraceptive device: Secondary | ICD-10-CM

## 2021-03-30 DIAGNOSIS — Z113 Encounter for screening for infections with a predominantly sexual mode of transmission: Secondary | ICD-10-CM | POA: Insufficient documentation

## 2021-03-30 HISTORY — DX: Cyst of Bartholin's gland: N75.0

## 2021-03-30 HISTORY — DX: Encounter for removal of intrauterine contraceptive device: Z30.432

## 2021-03-30 NOTE — Progress Notes (Signed)
GYNECOLOGY ANNUAL PREVENTATIVE CARE ENCOUNTER NOTE  History:     Crystal Hutchinson is a 40 y.o. G5P2002 female here for a routine annual gynecologic exam.  Current complaints: irregular bleeding since mirena placement 3 years ago.   Pt notes almost daily spotting with light bleeding for four days out of the month.  Pt given options of IUD removal or hormonal manipulation to address the irregular bleeding.  Pt desired IUD removal.  She may be considering tubal ligation in the future.   Gynecologic History Patient's last menstrual period was 03/13/2021 (approximate). Contraception: none Last Pap: greater than 3 years. Results were: normal with negative HPV   Obstetric History OB History  Gravida Para Term Preterm AB Living  2 2 2     2   SAB IAB Ectopic Multiple Live Births          2    # Outcome Date GA Lbr Len/2nd Weight Sex Delivery Anes PTL Lv  2 Term 2012    F CS-Unspec  N LIV  1 Term 2009    M CS-Unspec   LIV    Past Medical History:  Diagnosis Date   Abnormal Pap smear of cervix    Allergy    Blood in stool    DDD (degenerative disc disease), lumbar 2017   L4-L5 minimal retrolisthesis with shallow, noncompressive disc displacement or disc bulge.  L5-S1 degenerative disc disease broad-based left paracentral disc protrusion noted disc herniation with minimal caudal migration and associated peripheral annular tear with abutment of the ventral medial aspect of the proximal left descending S1 nerve root   History of UTI    Hx of migraines    Levoscoliosis 2015   "mild"   Rheumatoid arthritis ([facility]) 2016   positive ANA 1: 10240 and rheumatoid factor  160.    Past Surgical History:  Procedure Laterality Date   CESAREAN SECTION  2009, 2012   X2   FRACTURE SURGERY     LYMPHADENECTOMY     Procedure: Left L5-S1 transforaminal epidural steroid injection with fluoroscopy  02/06/2016    Current Outpatient Medications on File Prior to Visit  Medication Sig Dispense Refill    Ascorbic Acid (VITAMIN C WITH ROSE HIPS) 500 MG tablet Take 500 mg by mouth daily. (Patient not taking: Reported on 03/01/2021)     cetirizine (ZYRTEC) 10 MG tablet Take 1 tablet (10 mg total) by mouth daily. 90 tablet 1   Cholecalciferol (VITAMIN D) 50 MCG (2000 UT) CAPS Take 1 capsule by mouth daily.     diclofenac (VOLTAREN) 75 MG EC tablet Take 1 tablet (75 mg total) by mouth 2 (two) times daily. 30 tablet 0   fluticasone (FLONASE) 50 MCG/ACT nasal spray Place 2 sprays into both nostrils daily. 16 g 6   levonorgestrel (MIRENA) 20 MCG/24HR IUD 1 each by Intrauterine route once.     omeprazole (PRILOSEC) 20 MG capsule Take 1 capsule (20 mg total) by mouth daily. (Patient not taking: No sig reported) 30 capsule 3   senna (SENOKOT) 8.6 MG tablet Take 1-2 tablets (8.6-17.2 mg total) by mouth at bedtime. 60 tablet 5   No current facility-administered medications on file prior to visit.    Allergies  Allergen Reactions   Bactrim Ds [Sulfamethoxazole-Trimethoprim] Anaphylaxis and Rash    Social History:  reports that she has quit smoking. Her smoking use included cigarettes. She has never used smokeless tobacco. She reports current alcohol use of about 6.0 standard drinks of alcohol per week. She  reports that she does not use drugs.  Family History  Problem Relation Age of Onset   Rheum arthritis Mother        Significant   Fibromyalgia Mother    Arthritis Father    Heart attack Maternal Grandmother 26   Diabetes Maternal Grandmother    Healthy Brother    Colon cancer Neg Hx    Colon polyps Neg Hx     The following portions of the patient's history were reviewed and updated as appropriate: allergies, current medications, past family history, past medical history, past social history, past surgical history and problem list.  Review of Systems Pertinent items noted in HPI and remainder of comprehensive ROS otherwise negative.  Physical Exam:  BP (!) 121/91   Pulse 66   Wt 157 lb 12.8  oz (71.6 kg)   LMP 03/13/2021 (Approximate)   BMI 26.67 kg/m  CONSTITUTIONAL: Well-developed, well-nourished female in no acute distress.  HENT:  Normocephalic, atraumatic, External right and left ear normal. Oropharynx is clear and moist EYES: Conjunctivae and EOM are normal.  NECK: Normal range of motion, supple, no masses.  Normal thyroid.  SKIN: Skin is warm and dry. No rash noted. Not diaphoretic. No erythema. No pallor. MUSCULOSKELETAL: Normal range of motion. No tenderness.  No cyanosis, clubbing, or edema.  2+ distal pulses. NEUROLOGIC: Alert and oriented to person, place, and time. Normal reflexes, muscle tone coordination.  PSYCHIATRIC: Normal mood and affect. Normal behavior. Normal judgment and thought content. CARDIOVASCULAR: Normal heart rate noted, regular rhythm RESPIRATORY: Clear to auscultation bilaterally. Effort and breath sounds normal, no problems with respiration noted. BREASTS: Symmetric in size. No masses, tenderness, skin changes, nipple drainage, or lymphadenopathy bilaterally. Performed in the presence of a chaperone. ABDOMEN: Soft, no distention noted. Diffuse mild tenderness, no rebound or guarding.  PELVIC: Normal urethral meatus; normal appearing vaginal mucosa and cervix. Apparent nontender right bartholins cyst. No abnormal discharge noted.  Pap smear obtained.  Normal uterine size, no other palpable masses, no uterine or adnexal tenderness.  Performed in the presence of a chaperone.       GYNECOLOGY OFFICE PROCEDURE NOTE  Crystal Hutchinson is a 40 y.o. [id number] here for  IUD removal. No GYN concerns.  Last pap smear was on 03/30/21.  IUD Removal  Patient identified, informed consent performed, consent signed.  Patient was in the dorsal lithotomy position, normal external genitalia was noted.  A speculum was placed in the patient's vagina, normal discharge was noted, no lesions. The cervix was visualized, no lesions, no abnormal discharge.  The strings of the IUD  were grasped and pulled using ring forceps. The IUD was removed in its entirety.  (The strings of the IUD were not visualized, so Kelly forceps were introduced into the endometrial cavity and the IUD was grasped and removed in its entirety).  Patient tolerated the procedure well.    Patient will use nothing for contraception due to irregular bleeding.  Routine preventative health maintenance measures emphasized.   Mariel Aloe, MD, FACOG Obstetrician & Gynecologist, [facility] for [facility], [facility]  Assessment and Plan:    1. Screening mammogram for breast cancer Breast center requests mammogram results, ROI signed. - MM 3D SCREEN BREAST BILATERAL; Future  2. Screening for cervical cancer  - Cytology - PAP( [place])  3. Screening for STDs (sexually transmitted diseases)  - Cervicovaginal ancillary only( [place])  4. Breakthrough bleeding associated with intrauterine device (IUD) Pt requested IUD removal, see above  note.  Will have 3-4 month expectant management for evaluation of cycle  5. Women's annual routine gynecological examination   6. Screening examination for STD (sexually transmitted disease) Per pt request  7. Encounter for IUD removal   8. Bartholin's cyst Lesion noted, nontender, pt was not aware.  Expectant management for now.  If there is pain or difficulty walking/sitting, pt advised to follow up in office for I and D  Will follow up results of pap smear and manage accordingly. Mammogram scheduled Routine preventative health maintenance measures emphasized. Please refer to After Visit Summary for other counseling recommendations.   F/u in 4 months with virtual visit to discuss menstrual status     Mariel Aloe, MD, FACOG Obstetrician & Gynecologist, [facility] for [employer], [facility]

## 2021-03-31 LAB — CERVICOVAGINAL ANCILLARY ONLY
Chlamydia: NEGATIVE
Comment: NEGATIVE
Comment: NEGATIVE
Comment: NORMAL
Neisseria Gonorrhea: NEGATIVE
Trichomonas: NEGATIVE

## 2021-04-03 ENCOUNTER — Telehealth: Payer: Self-pay | Admitting: Lactation Services

## 2021-04-03 LAB — CYTOLOGY - PAP
Comment: NEGATIVE
High risk HPV: NEGATIVE

## 2021-04-03 NOTE — Telephone Encounter (Signed)
Called and spoke with patient in regards to abnormal cervical cells on PAP and need for the Colposcopy. Patient with no questions or concerns at this time. She was informed the front office will be calling to set up appointment for the Colposcopy.

## 2021-04-03 NOTE — Telephone Encounter (Signed)
-----   Message from Warden Fillers, MD sent at 04/03/2021 11:38 AM EDT ----- Low grade dysplasia seen on pap, recommend colposcopy

## 2021-04-10 ENCOUNTER — Encounter: Payer: Self-pay | Admitting: Family Medicine

## 2021-04-10 DIAGNOSIS — J020 Streptococcal pharyngitis: Secondary | ICD-10-CM | POA: Diagnosis not present

## 2021-05-10 ENCOUNTER — Ambulatory Visit: Payer: BC Managed Care – PPO | Admitting: Obstetrics and Gynecology

## 2021-05-17 ENCOUNTER — Ambulatory Visit: Payer: BC Managed Care – PPO

## 2021-05-23 ENCOUNTER — Telehealth: Payer: Self-pay | Admitting: General Practice

## 2021-05-23 NOTE — Telephone Encounter (Signed)
Patient called and left message on nurse voicemail line stating she has questions about her appt on 8/4. Called patient and she states she just started her period and it is kind of heavy. Patient wants to know if colposcopy needs to be rescheduled. Told patient providers can usually still do the colposcopy unless bleeding heavy but it is easier to see if someone is not bleeding and less uncomfortable for her. Patient stated she would like appt rescheduled. Told patient I would have someone from our front office reach out to her with a new appt. Patient desires next available and is okay with any provider.

## 2021-05-25 ENCOUNTER — Ambulatory Visit: Payer: BC Managed Care – PPO | Admitting: Obstetrics and Gynecology

## 2021-06-12 ENCOUNTER — Encounter: Payer: Self-pay | Admitting: Obstetrics and Gynecology

## 2021-06-12 ENCOUNTER — Ambulatory Visit (INDEPENDENT_AMBULATORY_CARE_PROVIDER_SITE_OTHER): Payer: BC Managed Care – PPO | Admitting: Obstetrics and Gynecology

## 2021-06-12 ENCOUNTER — Other Ambulatory Visit: Payer: Self-pay

## 2021-06-12 ENCOUNTER — Other Ambulatory Visit (HOSPITAL_COMMUNITY)
Admission: RE | Admit: 2021-06-12 | Discharge: 2021-06-12 | Disposition: A | Discharge status: Home / Self Care | Payer: BC Managed Care – PPO | Source: Ambulatory Visit | Attending: Obstetrics and Gynecology | Admitting: Obstetrics and Gynecology

## 2021-06-12 DIAGNOSIS — R87612 Low grade squamous intraepithelial lesion on cytologic smear of cervix (LGSIL): Secondary | ICD-10-CM | POA: Insufficient documentation

## 2021-06-12 DIAGNOSIS — Z3202 Encounter for pregnancy test, result negative: Secondary | ICD-10-CM | POA: Diagnosis not present

## 2021-06-12 DIAGNOSIS — N870 Mild cervical dysplasia: Secondary | ICD-10-CM | POA: Diagnosis not present

## 2021-06-12 HISTORY — DX: Low grade squamous intraepithelial lesion on cytologic smear of cervix (LGSIL): R87.612

## 2021-06-12 LAB — POCT PREGNANCY, URINE: Preg Test, Ur: NEGATIVE

## 2021-06-12 NOTE — Progress Notes (Signed)
Patient given informed consent, signed copy in the chart, time out was performed. Chart reviewed and CIN 1 noted on pap.  Pt noted no hx of biopsy but she did have cryosurgery on her cervix. UPT negative.   Placed in lithotomy position. Cervix viewed with speculum and colposcope after application of acetic acid and Lugol's solution.  Colposcopy adequate?  yes Acetowhite lesions? No visual lesions.  A band of nonstaining cervix was noted from 3 to 6 o'clock with lugol's solution.  Likely CIN 1 Punctation? no Mosaicism?  no Abnormal vasculature?  no Biopsies? Yes at 5 o'clock ECC? no  COMMENTS: Patient was given post procedure instructions.    Warden Fillers, MD

## 2021-06-14 LAB — SURGICAL PATHOLOGY

## 2021-07-11 DIAGNOSIS — M0579 Rheumatoid arthritis with rheumatoid factor of multiple sites without organ or systems involvement: Secondary | ICD-10-CM | POA: Diagnosis not present

## 2021-07-11 DIAGNOSIS — M3500 Sicca syndrome, unspecified: Secondary | ICD-10-CM | POA: Diagnosis not present

## 2021-07-22 ENCOUNTER — Encounter: Payer: Self-pay | Admitting: Radiology

## 2021-11-07 DIAGNOSIS — M3500 Sicca syndrome, unspecified: Secondary | ICD-10-CM | POA: Diagnosis not present

## 2021-11-07 DIAGNOSIS — M0579 Rheumatoid arthritis with rheumatoid factor of multiple sites without organ or systems involvement: Secondary | ICD-10-CM | POA: Diagnosis not present

## 2021-11-27 ENCOUNTER — Other Ambulatory Visit: Payer: Self-pay

## 2021-11-27 ENCOUNTER — Encounter: Payer: Self-pay | Admitting: Family Medicine

## 2021-11-27 ENCOUNTER — Other Ambulatory Visit (HOSPITAL_COMMUNITY)
Admission: RE | Admit: 2021-11-27 | Discharge: 2021-11-27 | Disposition: A | Discharge status: Home / Self Care | Payer: BC Managed Care – PPO | Source: Ambulatory Visit | Attending: Family Medicine | Admitting: Family Medicine

## 2021-11-27 ENCOUNTER — Ambulatory Visit (INDEPENDENT_AMBULATORY_CARE_PROVIDER_SITE_OTHER): Payer: BC Managed Care – PPO | Admitting: *Deleted

## 2021-11-27 VITALS — BP 131/87 | HR 65 | Ht 65.0 in | Wt 152.6 lb

## 2021-11-27 DIAGNOSIS — N898 Other specified noninflammatory disorders of vagina: Secondary | ICD-10-CM | POA: Diagnosis not present

## 2021-11-27 NOTE — Progress Notes (Signed)
Here for nurse visit for self swab. C/o over the last few months having vaginal discharge, states somedays none, sometimes a little, someday a lot. States it varies from yellow to light green. States when it is thicker has pain in ovaries and back. States started after had IUD, but went away after IUD removed and discharge started again.  Sotiria Keast,RN

## 2021-11-27 NOTE — Progress Notes (Signed)
Patient was evaluated by nursing staff. Agree with assessment and plan.  °

## 2021-11-28 ENCOUNTER — Encounter: Payer: Self-pay | Admitting: Family Medicine

## 2021-11-28 ENCOUNTER — Ambulatory Visit: Payer: BC Managed Care – PPO | Admitting: Family Medicine

## 2021-11-28 VITALS — BP 125/79 | HR 78 | Temp 98.5°F | Ht 65.0 in | Wt 153.0 lb

## 2021-11-28 DIAGNOSIS — R600 Localized edema: Secondary | ICD-10-CM | POA: Diagnosis not present

## 2021-11-28 DIAGNOSIS — R030 Elevated blood-pressure reading, without diagnosis of hypertension: Secondary | ICD-10-CM

## 2021-11-28 LAB — CERVICOVAGINAL ANCILLARY ONLY
Bacterial Vaginitis (gardnerella): NEGATIVE
Candida Glabrata: NEGATIVE
Candida Vaginitis: NEGATIVE
Chlamydia: NEGATIVE
Comment: NEGATIVE
Comment: NEGATIVE
Comment: NEGATIVE
Comment: NEGATIVE
Comment: NEGATIVE
Comment: NORMAL
Neisseria Gonorrhea: NEGATIVE
Trichomonas: NEGATIVE

## 2021-11-28 MED ORDER — HYDROCHLOROTHIAZIDE 12.5 MG PO CAPS: 12.5000 mg | ORAL_CAPSULE | Freq: Every day | ORAL | 3 refills | Status: DC

## 2021-11-28 NOTE — Patient Instructions (Addendum)
°  Start hctz daily in the morning.   Schedule physical with labs end of April

## 2021-11-28 NOTE — Progress Notes (Signed)
This visit occurred during the SARS-CoV-2 public health emergency.  Safety protocols were in place, including screening questions prior to the visit, additional usage of staff PPE, and extensive cleaning of exam room while observing appropriate contact time as indicated for disinfecting solutions.    Crystal Hutchinson , [birth year], 41 y.o., female MRN: [id number] Patient Care Team    Relationship Specialty Notifications Start End  Natalia LeatherwoodKuneff, Laurian Edrington A, DO PCP - General Family Medicine  02/13/21     Chief Complaint  Patient presents with   elevated BP at home    Pt reports 131/86 at appt yesterday;      Subjective: Pt presents for an OV with complaints of elevated bp without dx of htn.   Elevated BP without diagnosis of hypertension/Lower extremity edema Pt reports her BPs have been mildly elevated in the outpt setting at other doctors appt and when she checks at home. She states she has always had lower BP readings until the last few years.She endorses mild edema in hands and ankles/feet on occassions. She does not "add" salt to her diet when asked, but admits she tenderizes with [employer]ry[employer] season salt. She eats out about 2 times a month.  [facility] 11/28/2021 06/12/2021 03/30/2021 10/31/2020  Decreased Interest 0 0 1 0  Down, Depressed, Hopeless 0 0 0 0  PHQ - 2 Score 0 0 1 0  Altered sleeping - 0 2 0  Tired, decreased energy - 0 2 0  Change in appetite - 0 2 0  Feeling bad or failure about yourself  - 0 0 0  Trouble concentrating - 0 0 0  Moving slowly or fidgety/restless - 0 0 0  Suicidal thoughts - 0 0 0  PHQ-9 Score - 0 7 0  Difficult doing work/chores - - - Not difficult at all    Allergies  Allergen Reactions   Bactrim Ds [Sulfamethoxazole-Trimethoprim] Anaphylaxis and Rash   Social History   Social History Narrative   Marital status/children/pets: single.    Education/employment: Employed, education and occupation questionnaire  not completed by pt.     Safety:       -Wears a bicycle helmet riding a bike: Yes     -smoke alarm in the home:Yes     - wears seatbelt: Yes     - Feels safe in their relationships: Yes   Past Medical History:  Diagnosis Date   Abnormal Pap smear of cervix    Allergy    Blood in stool    DDD (degenerative disc disease), lumbar 2017   L4-L5 minimal retrolisthesis with shallow, noncompressive disc displacement or disc bulge.  L5-S1 degenerative disc disease broad-based left paracentral disc protrusion noted disc herniation with minimal caudal migration and associated peripheral annular tear with abutment of the ventral medial aspect of the proximal left descending S1 nerve root   History of UTI    Hx of migraines    Levoscoliosis 2015   "mild"   Rheumatoid arthritis (HCC) 2016   positive ANA 1: 10240 and rheumatoid factor  160.   Past Surgical History:  Procedure Laterality Date   CESAREAN SECTION  2009, 2012   X2   FRACTURE SURGERY     LYMPHADENECTOMY     Procedure: Left L5-S1 transforaminal epidural steroid injection with fluoroscopy  02/06/2016   Family History  Problem Relation Age of Onset   Rheum arthritis Mother        Significant   Fibromyalgia Mother    Arthritis  Father    Heart attack Maternal Grandmother 63   Diabetes Maternal Grandmother    Healthy Brother    Colon cancer Neg Hx    Colon polyps Neg Hx    Allergies as of 11/28/2021       Reactions   Bactrim Ds [sulfamethoxazole-trimethoprim] Anaphylaxis, Rash        Medication List        Accurate as of November 28, 2021  9:08 AM. If you have any questions, ask your nurse or doctor.          STOP taking these medications    omeprazole 20 MG capsule Commonly known as: PRILOSEC Stopped by: Felix Pacini, DO       TAKE these medications    cetirizine 10 MG tablet Commonly known as: ZYRTEC Take 1 tablet (10 mg total) by mouth daily.   diclofenac 75 MG EC tablet Commonly known as: VOLTAREN Take 1 tablet (75 mg total) by mouth 2  (two) times daily.   fluticasone 50 MCG/ACT nasal spray Commonly known as: FLONASE Place 2 sprays into both nostrils daily.   Humira Pen 40 MG/0.4ML Pnkt Generic drug: Adalimumab    hydrochlorothiazide 12.5 MG capsule Commonly known as: MICROZIDE Take 1 capsule (12.5 mg total) by mouth daily. Started by: Felix Pacini, DO   levonorgestrel 20 MCG/24HR IUD Commonly known as: MIRENA 1 each by Intrauterine route once.   loratadine 10 MG tablet Commonly known as: CLARITIN 1 tablet   senna 8.6 MG tablet Commonly known as: SENOKOT Take 1-2 tablets (8.6-17.2 mg total) by mouth at bedtime.   vitamin C with rose hips 500 MG tablet Take 500 mg by mouth daily.   Vitamin D 50 MCG (2000 UT) Caps Take 1 capsule by mouth daily.        All past medical history, surgical history, allergies, family history, immunizations andmedications were updated in the EMR today and reviewed under the history and medication portions of their EMR.     ROS Negative, with the exception of above mentioned in HPI   Objective:  BP 125/79    Pulse 78    Temp 98.5 F (36.9 C) (Oral)    Ht  (1.651 m)    Wt 153 lb (69.4 kg)    LMP 11/25/2021    SpO2 98%    BMI 25.46 kg/m  Body mass index is 25.46 kg/m. Physical Exam Vitals and nursing note reviewed.  Constitutional:      General: She is not in acute distress.    Appearance: Normal appearance. She is not ill-appearing, toxic-appearing or diaphoretic.  HENT:     Head: Normocephalic and atraumatic.  Eyes:     General: No scleral icterus.       Right eye: No discharge.        Left eye: No discharge.     Extraocular Movements: Extraocular movements intact.     Conjunctiva/sclera: Conjunctivae normal.     Pupils: Pupils are equal, round, and reactive to light.  Cardiovascular:     Rate and Rhythm: Normal rate and regular rhythm.     Heart sounds: No murmur heard.   No gallop.  Pulmonary:     Effort: Pulmonary effort is normal. No  respiratory distress.     Breath sounds: Normal breath sounds. No wheezing, rhonchi or rales.  Musculoskeletal:     Cervical back: Neck supple. No tenderness.     Right lower leg: No edema.     Left lower leg: No  edema.  Lymphadenopathy:     Cervical: No cervical adenopathy.  Skin:    General: Skin is warm and dry.     Coloration: Skin is not jaundiced or pale.     Findings: No erythema or rash.  Neurological:     Mental Status: She is alert and oriented to person, place, and time. Mental status is at baseline.     Motor: No weakness.     Gait: Gait normal.  Psychiatric:        Mood and Affect: Mood normal.        Behavior: Behavior normal.        Thought Content: Thought content normal.        Judgment: Judgment normal.    No results found. No results found. No results found for this or any previous visit (from the past 24 hour(s)).  Assessment/Plan: Uilani Sanville is a 41 y.o. female present for OV for  Elevated BP without diagnosis of hypertension/Lower extremity edema Diastolic pressure > 80 the majority of pressures in outpt setting. She endorses mild LE edema and hand edema. Discussed exercise, hydration and low sodium diet.  Start hctz 12.5 mg qd.  F/u 3 mos for cpe /follow up and fasting labs.   Reviewed expectations re: course of current medical issues. Discussed self-management of symptoms. Outlined signs and symptoms indicating need for more acute intervention. Patient verbalized understanding and all questions were answered. Patient received an After-Visit Summary.    No orders of the defined types were placed in this encounter.  Meds ordered this encounter  Medications   hydrochlorothiazide (MICROZIDE) 12.5 MG capsule    Sig: Take 1 capsule (12.5 mg total) by mouth daily.    Dispense:  90 capsule    Refill:  3   Referral Orders  No referral(s) requested today     Note is dictated utilizing voice recognition software. Although note has been proof read  prior to signing, occasional typographical errors still can be missed. If any questions arise, please do not hesitate to call for verification.   electronically signed by:  Felix Pacini, DO  [facility] [place]

## 2022-02-12 ENCOUNTER — Telehealth: Payer: Self-pay | Admitting: Family Medicine

## 2022-02-12 ENCOUNTER — Encounter: Payer: Self-pay | Admitting: Family Medicine

## 2022-02-12 ENCOUNTER — Ambulatory Visit (INDEPENDENT_AMBULATORY_CARE_PROVIDER_SITE_OTHER): Payer: BC Managed Care – PPO | Admitting: Family Medicine

## 2022-02-12 VITALS — BP 124/73 | HR 67 | Temp 98.3°F | Ht 65.0 in | Wt 156.0 lb

## 2022-02-12 DIAGNOSIS — M2550 Pain in unspecified joint: Secondary | ICD-10-CM | POA: Insufficient documentation

## 2022-02-12 DIAGNOSIS — Z131 Encounter for screening for diabetes mellitus: Secondary | ICD-10-CM

## 2022-02-12 DIAGNOSIS — R600 Localized edema: Secondary | ICD-10-CM

## 2022-02-12 DIAGNOSIS — M3500 Sicca syndrome, unspecified: Secondary | ICD-10-CM

## 2022-02-12 DIAGNOSIS — J301 Allergic rhinitis due to pollen: Secondary | ICD-10-CM

## 2022-02-12 DIAGNOSIS — Z1231 Encounter for screening mammogram for malignant neoplasm of breast: Secondary | ICD-10-CM

## 2022-02-12 DIAGNOSIS — Z0000 Encounter for general adult medical examination without abnormal findings: Secondary | ICD-10-CM | POA: Diagnosis not present

## 2022-02-12 DIAGNOSIS — R030 Elevated blood-pressure reading, without diagnosis of hypertension: Secondary | ICD-10-CM | POA: Diagnosis not present

## 2022-02-12 DIAGNOSIS — Z13220 Encounter for screening for lipoid disorders: Secondary | ICD-10-CM | POA: Diagnosis not present

## 2022-02-12 DIAGNOSIS — M069 Rheumatoid arthritis, unspecified: Secondary | ICD-10-CM | POA: Insufficient documentation

## 2022-02-12 DIAGNOSIS — Z23 Encounter for immunization: Secondary | ICD-10-CM

## 2022-02-12 DIAGNOSIS — Z1159 Encounter for screening for other viral diseases: Secondary | ICD-10-CM

## 2022-02-12 DIAGNOSIS — D72819 Decreased white blood cell count, unspecified: Secondary | ICD-10-CM

## 2022-02-12 LAB — COMPREHENSIVE METABOLIC PANEL
ALT: 16 U/L (ref 0–35)
AST: 18 U/L (ref 0–37)
Albumin: 4.5 g/dL (ref 3.5–5.2)
Alkaline Phosphatase: 47 U/L (ref 39–117)
BUN: 11 mg/dL (ref 6–23)
CO2: 27 mEq/L (ref 19–32)
Calcium: 9 mg/dL (ref 8.4–10.5)
Chloride: 103 mEq/L (ref 96–112)
Creatinine, Ser: 0.66 mg/dL (ref 0.40–1.20)
GFR: 107.71 mL/min (ref >60.00)
Glucose, Bld: 90 mg/dL (ref 70–99)
Potassium: 4.1 mEq/L (ref 3.5–5.1)
Sodium: 135 mEq/L (ref 135–145)
Total Bilirubin: 0.5 mg/dL (ref 0.2–1.2)
Total Protein: 7.9 g/dL (ref 6.0–8.3)

## 2022-02-12 LAB — LIPID PANEL
Cholesterol: 168 mg/dL (ref 0–200)
HDL: 68.1 mg/dL (ref >39.00)
LDL Cholesterol: 88 mg/dL (ref 0–99)
NonHDL: 99.84
Total CHOL/HDL Ratio: 2
Triglycerides: 58 mg/dL (ref 0.0–149.0)
VLDL: 11.6 mg/dL (ref 0.0–40.0)

## 2022-02-12 LAB — CBC
HCT: 37.7 % (ref 36.0–46.0)
Hemoglobin: 12.8 g/dL (ref 12.0–15.0)
MCHC: 34 g/dL (ref 30.0–36.0)
MCV: 92.2 fl (ref 78.0–100.0)
Platelets: 269 10*3/uL (ref 150.0–400.0)
RBC: 4.09 Mil/uL (ref 3.87–5.11)
RDW: 12 % (ref 11.5–15.5)
WBC: 2.4 10*3/uL — ABNORMAL LOW (ref 4.0–10.5)

## 2022-02-12 LAB — TSH: TSH: 1.49 u[IU]/mL (ref 0.35–5.50)

## 2022-02-12 LAB — HEMOGLOBIN A1C: Hgb A1c MFr Bld: 5.1 % (ref 4.6–6.5)

## 2022-02-12 NOTE — Progress Notes (Signed)
? ?This visit occurred during the SARS-CoV-2 public health emergency.  Safety protocols were in place, including screening questions prior to the visit, additional usage of staff PPE, and extensive cleaning of exam room while observing appropriate contact time as indicated for disinfecting solutions.  ? ? ?Patient ID: Crystal Hutchinson, female  DOB: 1981-09-25, 41 y.o.   MRN: [id number] ?Patient Care Team  ?  Relationship Specialty Notifications Start End  ?Natalia LeatherwoodKuneff, Zenobia Kuennen A, DO PCP - General Family Medicine  02/13/21   ?Warden FillersBass, Lawrence A, MD Consulting Physician Obstetrics and Gynecology  02/12/22   ?[facility], [facility]  02/12/22   ? ? ?Chief Complaint  ?Patient presents with  ? Annual Exam  ?  Pt is fasting  ? ?Subjective: ?Crystal Renae GlossShelton is a 41 y.o.  Female  present for CPE. ?All past medical history, surgical history, allergies, family history, immunizations, medications and social history were updated in the electronic medical record today. ?All recent labs, ED visits and hospitalizations within the last year were reviewed. ? ?Health maintenance:  ?Colonoscopy: no fhx. Routine screen at 43 ?Mammogram: no fhx. Ordered last year> will order again  for start of screening. ?Cervical cancer screening: last pap: 03/2021, completed by: GYN- [facility] ?Immunizations: tdap completed today, Influenza UTD 2022 (encouraged yearly), covid series completed ?Infectious disease screening: HIV completed during pregnancy, Hep C testing desired ?DEXA: consider early screen with RA history- has rheum ?Assistive device: none ?Oxygen NWG:NFAOuse:none ?Patient has a Dental home. ?Hospitalizations/ED visits: reviewed ? ?  ?Rheumatoid arthritis: est with rheum.  ?Prior note ?Patient reports she was diagnosed with rheumatoid arthritis when she was approximately 41 years old.  The majority of her discomfort is in bilateral hands and right elbow.  [place]  At one time she had been  started on methotrexate, and despite increasing doses her rheumatoid was never improved.  She states since that time her pain is okay, but it does prevent her from exercising as she would like.  She does take turmeric on occasions to help with discomfort. ?  ?  ? ?  11/28/2021  ?  8:32 AM 06/12/2021  ?  2:42 PM 03/30/2021  ?  4:34 PM 10/31/2020  ?  3:05 PM  ?Depression screen PHQ 2/9  ?Decreased Interest 0 0 1 0  ?Down, Depressed, Hopeless 0 0 0 0  ?PHQ - 2 Score 0 0 1 0  ?Altered sleeping  0 2 0  ?Tired, decreased energy  0 2 0  ?Change in appetite  0 2 0  ?Feeling bad or failure about yourself   0 0 0  ?Trouble concentrating  0 0 0  ?Moving slowly or fidgety/restless  0 0 0  ?Suicidal thoughts  0 0 0  ?PHQ-9 Score  0 7 0  ?Difficult doing work/chores    Not difficult at all  ? ? ?  06/12/2021  ?  2:42 PM 03/30/2021  ?  4:34 PM  ?GAD 7 : Generalized Anxiety Score  ?Nervous, Anxious, on Edge 0 0  ?Control/stop worrying 0 0  ?Worry too much - different things 0 1  ?Trouble relaxing 0 0  ?Restless 0 0  ?Easily annoyed or irritable 1 1  ?Afraid - awful might happen 0 0  ?Total GAD 7 Score 1 2  ? ? ? ?Immunization History  ?Administered Date(s) Administered  ? Influenza-Unspecified 08/22/2020, 08/22/2021  ? PFIZER(Purple Top)SARS-COV-2 Vaccination 01/28/2020, 02/19/2020  ? Tdap 02/12/2022  ? ? ?Past  Medical History:  ?Diagnosis Date  ? Abnormal Pap smear of cervix   ? Allergy   ? Blood in stool   ? DDD (degenerative disc disease), lumbar 2017  ? L4-L5 minimal retrolisthesis with shallow, noncompressive disc displacement or disc bulge.  L5-S1 degenerative disc disease broad-based left paracentral disc protrusion noted disc herniation with minimal caudal migration and associated peripheral annular tear with abutment of the ventral medial aspect of the proximal left descending S1 nerve root  ? Encounter for IUD removal 03/30/2021  ? Epigastric pain 03/01/2021  ? History of UTI   ? Hx of migraines   ? Levoscoliosis 2015  ? "mild"  ?  Regurgitation of food 03/01/2021  ? Rheumatoid arthritis (HCC) 2016  ? positive ANA 1: 10240 and rheumatoid factor  160.  ? ?Allergies  ?Allergen Reactions  ? Bactrim Ds [Sulfamethoxazole-Trimethoprim] Anaphylaxis and Rash  ? ?Past Surgical History:  ?Procedure Laterality Date  ? CESAREAN SECTION  2009, 2012  ? X2  ? FRACTURE SURGERY    ? LYMPHADENECTOMY    ? Procedure: Left L5-S1 transforaminal epidural steroid injection with fluoroscopy  02/06/2016  ? ?Family History  ?Problem Relation Age of Onset  ? Rheum arthritis Mother   ?     Significant  ? Fibromyalgia Mother   ? Arthritis Father   ? Heart attack Maternal Grandmother 93  ? Diabetes Maternal Grandmother   ? Healthy Brother   ? Colon cancer Neg Hx   ? Colon polyps Neg Hx   ? ?Social History  ? ?Social History Narrative  ? Marital status/children/pets: single.   ? Education/employment: Employed, education and occupation questionnaire  not completed by pt.    ? Safety:   ?   -Wears a bicycle helmet riding a bike: Yes  ?   -smoke alarm in the home:Yes  ?   - wears seatbelt: Yes  ?   - Feels safe in their relationships: Yes  ? ? ?Allergies as of 02/12/2022   ? ?   Reactions  ? Bactrim Ds [sulfamethoxazole-trimethoprim] Anaphylaxis, Rash  ? ?  ? ?  ?Medication List  ?  ? ?  ? Accurate as of February 12, 2022  9:26 AM. If you have any questions, ask your nurse or doctor.  ?  ?  ? ?  ? ?STOP taking these medications   ? ?fluticasone 50 MCG/ACT nasal spray ?Commonly known as: FLONASE ?Stopped by: Felix Pacini, DO ?  ?hydrochlorothiazide 12.5 MG capsule ?Commonly known as: MICROZIDE ?Stopped by: Felix Pacini, DO ?  ? ?  ? ?TAKE these medications   ? ?cetirizine 10 MG tablet ?Commonly known as: ZYRTEC ?Take 1 tablet (10 mg total) by mouth daily. ?  ?Humira Pen 40 MG/0.4ML Pnkt ?Generic drug: Adalimumab ?40mg  ?  ?loratadine 10 MG tablet ?Commonly known as: CLARITIN ?1 tablet ?  ?vitamin C with rose hips 500 MG tablet ?Take 500 mg by mouth daily. ?  ?Vitamin D 50 MCG (2000  UT) Caps ?Take 1 capsule by mouth daily. ?  ? ?  ? ? ?All past medical history, surgical history, allergies, family history, immunizations andmedications were updated in the EMR today and reviewed under the history and medication portions of their EMR.    ? ? ?No results found. ? ?ROS ?14 pt review of systems performed and negative (unless mentioned in an HPI) ?Objective: ?BP 124/73   Pulse 67   Temp 98.3 ?F (36.8 ?C) (Oral)   Ht 5\' 5"  (1.651 m)   Wt  156 lb (70.8 kg)   LMP 02/09/2022   SpO2 100%   BMI 25.96 kg/m?  ?Physical Exam ?Vitals and nursing note reviewed.  ?Constitutional:   ?   General: She is not in acute distress. ?   Appearance: Normal appearance. She is not ill-appearing or toxic-appearing.  ?HENT:  ?   Head: Normocephalic and atraumatic.  ?   Right Ear: Tympanic membrane, ear canal and external ear normal. There is no impacted cerumen.  ?   Left Ear: Tympanic membrane, ear canal and external ear normal. There is no impacted cerumen.  ?   Nose: No congestion or rhinorrhea.  ?   Mouth/Throat:  ?   Mouth: Mucous membranes are moist.  ?   Pharynx: Oropharynx is clear. No oropharyngeal exudate or posterior oropharyngeal erythema.  ?Eyes:  ?   General: No scleral icterus.    ?   Right eye: No discharge.     ?   Left eye: No discharge.  ?   Extraocular Movements: Extraocular movements intact.  ?   Conjunctiva/sclera: Conjunctivae normal.  ?   Pupils: Pupils are equal, round, and reactive to light.  ?Cardiovascular:  ?   Rate and Rhythm: Normal rate and regular rhythm.  ?   Pulses: Normal pulses.  ?   Heart sounds: Normal heart sounds. No murmur heard. ?  No friction rub. No gallop.  ?Pulmonary:  ?   Effort: Pulmonary effort is normal. No respiratory distress.  ?   Breath sounds: Normal breath sounds. No stridor. No wheezing, rhonchi or rales.  ?Chest:  ?   Chest wall: No tenderness.  ?Abdominal:  ?   General: Abdomen is flat. Bowel sounds are normal. There is no distension.  ?   Palpations: Abdomen is  soft. There is no mass.  ?   Tenderness: There is no abdominal tenderness. There is no right CVA tenderness, left CVA tenderness, guarding or rebound.  ?   Hernia: No hernia is present.  ?Musculoskeletal:     ?   Ge

## 2022-02-12 NOTE — Patient Instructions (Addendum)
? ?Return in about 1 year (around 02/14/2023) for cpe (20 min). ? ? ?Health Maintenance, Female ?Adopting a healthy lifestyle and getting preventive care are important in promoting health and wellness. Ask your health care provider about: ?The right schedule for you to have regular tests and exams. ?Things you can do on your own to prevent diseases and keep yourself healthy. ?What should I know about diet, weight, and exercise? ?Eat a healthy diet ? ?Eat a diet that includes plenty of vegetables, fruits, low-fat dairy products, and lean protein. ?Do not eat a lot of foods that are high in solid fats, added sugars, or sodium. ?Maintain a healthy weight ?Body mass index (BMI) is used to identify weight problems. It estimates body fat based on height and weight. Your health care provider can help determine your BMI and help you achieve or maintain a healthy weight. ?Get regular exercise ?Get regular exercise. This is one of the most important things you can do for your health. Most adults should: ?Exercise for at least 150 minutes each week. The exercise should increase your heart rate and make you sweat (moderate-intensity exercise). ?Do strengthening exercises at least twice a week. This is in addition to the moderate-intensity exercise. ?Spend less time sitting. Even light physical activity can be beneficial. ?Watch cholesterol and blood lipids ?Have your blood tested for lipids and cholesterol at 43 years of age, then have this test every 5 years. ?Have your cholesterol levels checked more often if: ?Your lipid or cholesterol levels are high. ?You are older than 43 years of age. ?You are at high risk for heart disease. ?What should I know about cancer screening? ?Depending on your health history and family history, you may need to have cancer screening at various ages. This may include screening for: ?Breast cancer. ?Cervical cancer. ?Colorectal cancer. ?Skin cancer. ?Lung cancer. ?What should I know about heart  disease, diabetes, and high blood pressure? ?Blood pressure and heart disease ?High blood pressure causes heart disease and increases the risk of stroke. This is more likely to develop in people who have high blood pressure readings or are overweight. ?Have your blood pressure checked: ?Every 3-5 years if you are 7518-43 years of age. ?Every year if you are 43 years old or older. ?Diabetes ?Have regular diabetes screenings. This checks your fasting blood sugar level. Have the screening done: ?Once every three years after age 43 if you are at a normal weight and have a low risk for diabetes. ?More often and at a younger age if you are overweight or have a high risk for diabetes. ?What should I know about preventing infection? ?Hepatitis B ?If you have a higher risk for hepatitis B, you should be screened for this virus. Talk with your health care provider to find out if you are at risk for hepatitis B infection. ?Hepatitis C ?Testing is recommended for: ?Everyone born from [birth year] through 1967. ?Anyone with known risk factors for hepatitis C. ?Sexually transmitted infections (STIs) ?Get screened for STIs, including gonorrhea and chlamydia, if: ?You are sexually active and are younger than 43 years of age. ?You are older than 43 years of age and your health care provider tells you that you are at risk for this type of infection. ?Your sexual activity has changed since you were last screened, and you are at increased risk for chlamydia or gonorrhea. Ask your health care provider if you are at risk. ?Ask your health care provider about whether you are at high risk for  HIV. Your health care provider may recommend a prescription medicine to help prevent HIV infection. If you choose to take medicine to prevent HIV, you should first get tested for HIV. You should then be tested every 3 months for as long as you are taking the medicine. ?Pregnancy ?If you are about to stop having your period (premenopausal) and you may become  pregnant, seek counseling before you get pregnant. ?Take 400 to 800 micrograms (mcg) of folic acid every day if you become pregnant. ?Ask for birth control (contraception) if you want to prevent pregnancy. ?Osteoporosis and menopause ?Osteoporosis is a disease in which the bones lose minerals and strength with aging. This can result in bone fractures. If you are 59 years old or older, or if you are at risk for osteoporosis and fractures, ask your health care provider if you should: ?Be screened for bone loss. ?Take a calcium or vitamin D supplement to lower your risk of fractures. ?Be given hormone replacement therapy (HRT) to treat symptoms of menopause. ?Follow these instructions at home: ?Alcohol use ?Do not drink alcohol if: ?Your health care provider tells you not to drink. ?You are pregnant, may be pregnant, or are planning to become pregnant. ?If you drink alcohol: ?Limit how much you have to: ?0-1 drink a day. ?Know how much alcohol is in your drink. In the [place] one drink equals one 12 oz bottle of beer (355 mL), one 5 oz glass of wine (148 mL), or one 1? oz glass of hard liquor (44 mL). ?Lifestyle ?Do not use any products that contain nicotine or tobacco. These products include cigarettes, chewing tobacco, and vaping devices, such as e-cigarettes. If you need help quitting, ask your health care provider. ?Do not use street drugs. ?Do not share needles. ?Ask your health care provider for help if you need support or information about quitting drugs. ?General instructions ?Schedule regular health, dental, and eye exams. ?Stay current with your vaccines. ?Tell your health care provider if: ?You often feel depressed. ?You have ever been abused or do not feel safe at home. ?Summary ?Adopting a healthy lifestyle and getting preventive care are important in promoting health and wellness. ?Follow your health care provider's instructions about healthy diet, exercising, and getting tested or screened for  diseases. ?Follow your health care provider's instructions on monitoring your cholesterol and blood pressure. ?This information is not intended to replace advice given to you by your health care provider. Make sure you discuss any questions you have with your health care provider. ?Document Revised: 02/27/2021 Document Reviewed: 02/27/2021 ?Elsevier Patient Education ? 2023 [employer] ? ?

## 2022-02-12 NOTE — Telephone Encounter (Signed)
Please call patient ?Liver, kidney and thyroid function are normal ?electrolytes are normal ? ?Diabetes screening/A1c is normal  ?Cholesterol panel is great and at goal for her.  ?Hep c screen is pending, this can take an extra day. We will call if abnl result.  ? ? ? ?Wbc appear mildly low in total count, would advise lab appt only in 4 weeks to retest. It is normal for levels to go up and down, but the particular types of wbc should remain normal. Order placed for 4 weeks lab appt only ?

## 2022-02-13 LAB — HEPATITIS C ANTIBODY
Hepatitis C Ab: NONREACTIVE
SIGNAL TO CUT-OFF: 0.04 (ref <1.00)

## 2022-02-13 NOTE — Telephone Encounter (Signed)
Spoke with pt regarding labs and instructions.   

## 2022-02-27 ENCOUNTER — Ambulatory Visit
Admission: RE | Admit: 2022-02-27 | Discharge: 2022-02-27 | Disposition: A | Discharge status: Home / Self Care | Payer: BC Managed Care – PPO | Source: Ambulatory Visit | Attending: Family Medicine | Admitting: Family Medicine

## 2022-02-27 DIAGNOSIS — Z1231 Encounter for screening mammogram for malignant neoplasm of breast: Secondary | ICD-10-CM | POA: Diagnosis not present

## 2022-03-15 ENCOUNTER — Ambulatory Visit (INDEPENDENT_AMBULATORY_CARE_PROVIDER_SITE_OTHER): Payer: BC Managed Care – PPO

## 2022-03-15 DIAGNOSIS — D72819 Decreased white blood cell count, unspecified: Secondary | ICD-10-CM

## 2022-03-15 LAB — CBC WITH DIFFERENTIAL/PLATELET
Basophils Absolute: 0 10*3/uL (ref 0.0–0.1)
Basophils Relative: 0.7 % (ref 0.0–3.0)
Eosinophils Absolute: 0.1 10*3/uL (ref 0.0–0.7)
Eosinophils Relative: 3.1 % (ref 0.0–5.0)
HCT: 36.3 % (ref 36.0–46.0)
Hemoglobin: 12.5 g/dL (ref 12.0–15.0)
Lymphocytes Relative: 37.1 % (ref 12.0–46.0)
Lymphs Abs: 1.2 10*3/uL (ref 0.7–4.0)
MCHC: 34.5 g/dL (ref 30.0–36.0)
MCV: 89.7 fl (ref 78.0–100.0)
Monocytes Absolute: 0.3 10*3/uL (ref 0.1–1.0)
Monocytes Relative: 11 % (ref 3.0–12.0)
Neutro Abs: 1.5 10*3/uL (ref 1.4–7.7)
Neutrophils Relative %: 48.1 % (ref 43.0–77.0)
Platelets: 247 10*3/uL (ref 150.0–400.0)
RBC: 4.05 Mil/uL (ref 3.87–5.11)
RDW: 11.7 % (ref 11.5–15.5)
WBC: 3.2 10*3/uL — ABNORMAL LOW (ref 4.0–10.5)

## 2022-05-07 DIAGNOSIS — M3500 Sicca syndrome, unspecified: Secondary | ICD-10-CM | POA: Diagnosis not present

## 2022-05-07 DIAGNOSIS — M0579 Rheumatoid arthritis with rheumatoid factor of multiple sites without organ or systems involvement: Secondary | ICD-10-CM | POA: Diagnosis not present

## 2022-09-07 ENCOUNTER — Ambulatory Visit: Payer: BC Managed Care – PPO | Admitting: Obstetrics and Gynecology

## 2022-09-28 ENCOUNTER — Other Ambulatory Visit: Payer: Self-pay

## 2022-09-28 ENCOUNTER — Encounter: Payer: Self-pay | Admitting: Obstetrics and Gynecology

## 2022-09-28 ENCOUNTER — Other Ambulatory Visit (HOSPITAL_COMMUNITY)
Admission: RE | Admit: 2022-09-28 | Discharge: 2022-09-28 | Disposition: A | Discharge status: Home / Self Care | Payer: BC Managed Care – PPO | Source: Ambulatory Visit | Attending: Obstetrics and Gynecology | Admitting: Obstetrics and Gynecology

## 2022-09-28 ENCOUNTER — Ambulatory Visit (INDEPENDENT_AMBULATORY_CARE_PROVIDER_SITE_OTHER): Payer: BC Managed Care – PPO | Admitting: Obstetrics and Gynecology

## 2022-09-28 VITALS — BP 147/90 | HR 74 | Ht 65.0 in | Wt 162.0 lb

## 2022-09-28 DIAGNOSIS — R87612 Low grade squamous intraepithelial lesion on cytologic smear of cervix (LGSIL): Secondary | ICD-10-CM

## 2022-09-28 DIAGNOSIS — Z1151 Encounter for screening for human papillomavirus (HPV): Secondary | ICD-10-CM | POA: Insufficient documentation

## 2022-09-28 DIAGNOSIS — Z113 Encounter for screening for infections with a predominantly sexual mode of transmission: Secondary | ICD-10-CM | POA: Insufficient documentation

## 2022-09-28 DIAGNOSIS — R030 Elevated blood-pressure reading, without diagnosis of hypertension: Secondary | ICD-10-CM | POA: Diagnosis not present

## 2022-09-28 DIAGNOSIS — Z01419 Encounter for gynecological examination (general) (routine) without abnormal findings: Secondary | ICD-10-CM

## 2022-09-28 NOTE — Progress Notes (Signed)
GYNECOLOGY ANNUAL PREVENTATIVE CARE ENCOUNTER NOTE  History:     Crystal Hutchinson is a 41 y.o. [id number] female here for a routine annual gynecologic exam.  Current complaints: occasional abdominal pain.   Pt does note slightly heavier menstrual bleeding on days 2-4.  Menses are regular and last 5-7 days. Menses are heavier in the middle of her cycle.  Husband has had a vasectomy.   Gynecologic History Patient's last menstrual period was 09/05/2022 (exact date). Contraception: vasectomy Last Pap: 03/30/21. Results were: abnormal with CIN 1 Last mammogram: 02/27/21. Results were: normal  Obstetric History OB History  Gravida Para Term Preterm AB Living  2 2 2     2   SAB IAB Ectopic Multiple Live Births          2    # Outcome Date GA Lbr Len/2nd Weight Sex Delivery Anes PTL Lv  2 Term 2012    F CS-Unspec  N LIV  1 Term 2009    M CS-Unspec   LIV    Past Medical History:  Diagnosis Date   Abnormal Pap smear of cervix    Allergy    Blood in stool    DDD (degenerative disc disease), lumbar 2017   L4-L5 minimal retrolisthesis with shallow, noncompressive disc displacement or disc bulge.  L5-S1 degenerative disc disease broad-based left paracentral disc protrusion noted disc herniation with minimal caudal migration and associated peripheral annular tear with abutment of the ventral medial aspect of the proximal left descending S1 nerve root   Encounter for IUD removal 03/30/2021   Epigastric pain 03/01/2021   History of UTI    Hx of migraines    Levoscoliosis 2015   "mild"   Regurgitation of food 03/01/2021   Rheumatoid arthritis (HCC) 2016   positive ANA 1: 10240 and rheumatoid factor  160.    Past Surgical History:  Procedure Laterality Date   CESAREAN SECTION  2009, 2012   X2   FRACTURE SURGERY     LYMPHADENECTOMY     Procedure: Left L5-S1 transforaminal epidural steroid injection with fluoroscopy  02/06/2016    Current Outpatient Medications on File Prior to Visit  Medication  Sig Dispense Refill   Adalimumab (HUMIRA PEN) 40 MG/0.4ML PNKT 40mg      cetirizine (ZYRTEC) 10 MG tablet Take 1 tablet (10 mg total) by mouth daily. 90 tablet 1   Cholecalciferol (VITAMIN D) 50 MCG (2000 UT) CAPS Take 1 capsule by mouth daily.     hydrochlorothiazide (MICROZIDE) 12.5 MG capsule Take 12.5 mg by mouth daily.     loratadine (CLARITIN) 10 MG tablet 1 tablet     Ascorbic Acid (VITAMIN C WITH ROSE HIPS) 500 MG tablet Take 500 mg by mouth daily. (Patient not taking: Reported on 09/28/2022)     No current facility-administered medications on file prior to visit.    Allergies  Allergen Reactions   Bactrim Ds [Sulfamethoxazole-Trimethoprim] Anaphylaxis and Rash    Social History:  reports that she quit smoking about 15 years ago. Her smoking use included cigarettes. She has never used smokeless tobacco. She reports current alcohol use of about 6.0 standard drinks of alcohol per week. She reports that she does not use drugs.  Family History  Problem Relation Age of Onset   Rheum arthritis Mother        Significant   Fibromyalgia Mother    Arthritis Father    Heart attack Maternal Grandmother 89   Diabetes Maternal Grandmother    Healthy Brother  Colon cancer Neg Hx    Colon polyps Neg Hx     The following portions of the patient's history were reviewed and updated as appropriate: allergies, current medications, past family history, past medical history, past social history, past surgical history and problem list.  Review of Systems Pertinent items noted in HPI and remainder of comprehensive ROS otherwise negative.  Physical Exam:  BP (!) 147/90   Pulse 74   Ht 5\' 5"  (1.651 m)   Wt 162 lb (73.5 kg)   LMP 09/05/2022 (Exact Date) Comment: lasted 5-6 days; light to heavy period  BMI 26.96 kg/m  CONSTITUTIONAL: Well-developed, well-nourished female in no acute distress.  HENT:  Normocephalic, atraumatic, External right and left ear normal. Oropharynx is clear and  moist EYES: Conjunctivae and EOM are normal. Pupils are equal, round, and reactive to light. No scleral icterus.  NECK: Normal range of motion, supple, no masses.  Normal thyroid.  SKIN: Skin is warm and dry. No rash noted. Not diaphoretic. No erythema. No pallor. MUSCULOSKELETAL: Normal range of motion. No tenderness.  No cyanosis, clubbing, or edema.  2+ distal pulses. NEUROLOGIC: Alert and oriented to person, place, and time. Normal reflexes, muscle tone coordination.  PSYCHIATRIC: Normal mood and affect. Normal behavior. Normal judgment and thought content. CARDIOVASCULAR: Normal heart rate noted, regular rhythm RESPIRATORY: Clear to auscultation bilaterally. Effort and breath sounds normal, no problems with respiration noted. BREASTS: Symmetric in size. No masses, tenderness, skin changes, nipple drainage, or lymphadenopathy bilaterally. Performed in the presence of a chaperone. ABDOMEN: Soft, no distention noted.  No tenderness, rebound or guarding.  PELVIC: Normal appearing external genitalia and urethral meatus; normal appearing vaginal mucosa and cervix.  No abnormal discharge noted.  Pap smear obtained.  Normal uterine size, no other palpable masses, no uterine or adnexal tenderness.  Performed in the presence of a chaperone.   Assessment and Plan:    1. Well female exam with routine gynecological exam Normal annual exam  Regarding slightly increased menstrual bleeding discussed options including lysteda and depo provera.  Pt has tried progesterone IUD and did not do well with it.  Currently, she is not aq good candidate for combined OCP due to elevated BP with slightly poor control.  - Cytology - PAP( [place]) - Cervicovaginal ancillary only( [place])  2. Low grade squamous intraepithelial lesion (LGSIL) on cervical Pap smear Repap taken today, cervix slightly friable Pt has hx of cryotherapy in the past  3. Elevated BP without diagnosis of hypertension Discussed  slightly elevated BP with pt.  She is taking HCTZ for treatment, pt advised to follow up with her PCP regarding improved control of her HTN  4. Routine screening for STI (sexually transmitted infection) Per pt request - Cervicovaginal ancillary only( [place])  Will follow up results of pap smear and manage accordingly. Routine preventative health maintenance measures emphasized. Please refer to After Visit Summary for other counseling recommendations.      09/07/2022, MD, FACOG Obstetrician & Gynecologist, [facility] for [facility], [facility]

## 2022-10-01 LAB — CERVICOVAGINAL ANCILLARY ONLY
Bacterial Vaginitis (gardnerella): POSITIVE — AB
Candida Glabrata: NEGATIVE
Candida Vaginitis: NEGATIVE
Chlamydia: NEGATIVE
Comment: NEGATIVE
Comment: NEGATIVE
Comment: NEGATIVE
Comment: NEGATIVE
Comment: NEGATIVE
Comment: NORMAL
Neisseria Gonorrhea: NEGATIVE
Trichomonas: NEGATIVE

## 2022-10-02 LAB — CYTOLOGY - PAP
Comment: NEGATIVE
Diagnosis: UNDETERMINED — AB
High risk HPV: NEGATIVE

## 2022-10-04 ENCOUNTER — Other Ambulatory Visit: Payer: Self-pay

## 2022-10-04 ENCOUNTER — Encounter: Payer: Self-pay | Admitting: Obstetrics and Gynecology

## 2022-10-04 DIAGNOSIS — B009 Herpesviral infection, unspecified: Secondary | ICD-10-CM

## 2022-10-04 DIAGNOSIS — B9689 Other specified bacterial agents as the cause of diseases classified elsewhere: Secondary | ICD-10-CM

## 2022-10-04 MED ORDER — METRONIDAZOLE 500 MG PO TABS: 500.0000 mg | ORAL_TABLET | Freq: Two times a day (BID) | ORAL | 0 refills | Status: DC

## 2022-10-04 MED ORDER — VALACYCLOVIR HCL 500 MG PO TABS: 500.0000 mg | ORAL_TABLET | Freq: Two times a day (BID) | ORAL | 11 refills | Status: DC

## 2022-10-04 MED ORDER — VALACYCLOVIR HCL 500 MG PO TABS: 500.0000 mg | ORAL_TABLET | Freq: Two times a day (BID) | ORAL | 0 refills | Status: DC

## 2022-10-04 NOTE — Addendum Note (Signed)
Addended by: Kathee Delton on: 10/04/2022 02:35 PM   Modules accepted: Orders

## 2022-10-05 ENCOUNTER — Encounter: Payer: Self-pay | Admitting: Family Medicine

## 2022-10-09 ENCOUNTER — Ambulatory Visit: Payer: BC Managed Care – PPO | Admitting: Family Medicine

## 2022-10-09 VITALS — BP 124/82 | HR 91 | Temp 98.3°F | Ht 65.0 in | Wt 163.0 lb

## 2022-10-09 DIAGNOSIS — R0981 Nasal congestion: Secondary | ICD-10-CM | POA: Diagnosis not present

## 2022-10-09 DIAGNOSIS — H6690 Otitis media, unspecified, unspecified ear: Secondary | ICD-10-CM

## 2022-10-09 LAB — POC COVID19 BINAXNOW: SARS Coronavirus 2 Ag: NEGATIVE

## 2022-10-09 LAB — POCT INFLUENZA A/B
Influenza A, POC: NEGATIVE
Influenza B, POC: NEGATIVE

## 2022-10-09 MED ORDER — FLUCONAZOLE 150 MG PO TABS: 150.0000 mg | ORAL_TABLET | Freq: Once | ORAL | 0 refills | Status: AC

## 2022-10-09 MED ORDER — AMOXICILLIN-POT CLAVULANATE 875-125 MG PO TABS: 1.0000 | ORAL_TABLET | Freq: Two times a day (BID) | ORAL | 0 refills | Status: DC

## 2022-10-09 NOTE — Progress Notes (Signed)
Crystal Hutchinson , 1981/10/10, 41 y.o., female MRN: [id number] Patient Care Team    Relationship Specialty Notifications Start End  Natalia Leatherwood, DO PCP - General Family Medicine  02/13/21   Warden Fillers, MD Consulting Physician Obstetrics and Gynecology  02/12/22   Rheumatology, [facility]  Rheumatology  02/12/22     Chief Complaint  Patient presents with   Hypertension    X 6 mos; last BP 1.5 weeks ago 149 systolic    Sinus Problem    Pt c/o nasal congestions, HA, x 2 days     Subjective: Pt presents for an OV with complaints of congestion, headache x 2 days.  She also reports her left ear has started hurting since she has been at the office.  She denies fever or chills.      09/28/2022   10:17 AM 11/28/2021    8:32 AM 06/12/2021    2:42 PM 03/30/2021    4:34 PM 10/31/2020    3:05 PM  Depression screen PHQ 2/9  Decreased Interest 1 0 0 1 0  Down, Depressed, Hopeless 0 0 0 0 0  PHQ - 2 Score 1 0 0 1 0  Altered sleeping 2  0 2 0  Tired, decreased energy 1  0 2 0  Change in appetite 0  0 2 0  Feeling bad or failure about yourself  0  0 0 0  Trouble concentrating 0  0 0 0  Moving slowly or fidgety/restless 0  0 0 0  Suicidal thoughts 0  0 0 0  PHQ-9 Score 4  0 7 0  Difficult doing work/chores     Not difficult at all    Allergies  Allergen Reactions   Bactrim Ds [Sulfamethoxazole-Trimethoprim] Anaphylaxis and Rash   Social History   Social History Narrative   Marital status/children/pets: single.    Education/employment: Employed, education and occupation questionnaire  not completed by pt.     Safety:      -Wears a bicycle helmet riding a bike: Yes     -smoke alarm in the home:Yes     - wears seatbelt: Yes     - Feels safe in their relationships: Yes   Past Medical History:  Diagnosis Date   Abnormal Pap smear of cervix    Allergy    Blood in stool    DDD (degenerative disc disease), lumbar 2017   L4-L5 minimal retrolisthesis with shallow,  noncompressive disc displacement or disc bulge.  L5-S1 degenerative disc disease broad-based left paracentral disc protrusion noted disc herniation with minimal caudal migration and associated peripheral annular tear with abutment of the ventral medial aspect of the proximal left descending S1 nerve root   Encounter for IUD removal 03/30/2021   Epigastric pain 03/01/2021   History of UTI    Hx of migraines    Levoscoliosis 2015   "mild"   Regurgitation of food 03/01/2021   Rheumatoid arthritis (HCC) 2016   positive ANA 1: 10240 and rheumatoid factor  160.   Past Surgical History:  Procedure Laterality Date   CESAREAN SECTION  2009, 2012   X2   FRACTURE SURGERY     LYMPHADENECTOMY     Procedure: Left L5-S1 transforaminal epidural steroid injection with fluoroscopy  02/06/2016   Family History  Problem Relation Age of Onset   Rheum arthritis Mother        Significant   Fibromyalgia Mother    Arthritis Father    Heart attack  Maternal Grandmother 61   Diabetes Maternal Grandmother    Healthy Brother    Colon cancer Neg Hx    Colon polyps Neg Hx    Allergies as of 10/09/2022       Reactions   Bactrim Ds [sulfamethoxazole-trimethoprim] Anaphylaxis, Rash        Medication List        Accurate as of October 09, 2022  4:36 PM. If you have any questions, ask your nurse or doctor.          STOP taking these medications    hydrochlorothiazide 12.5 MG capsule Commonly known as: MICROZIDE Stopped by: Felix Pacini, DO   loratadine 10 MG tablet Commonly known as: CLARITIN Stopped by: Felix Pacini, DO   vitamin C with rose hips 500 MG tablet Stopped by: Felix Pacini, DO       TAKE these medications    amoxicillin-clavulanate 875-125 MG tablet Commonly known as: AUGMENTIN Take 1 tablet by mouth 2 (two) times daily. Started by: Felix Pacini, DO   cetirizine 10 MG tablet Commonly known as: ZYRTEC Take 1 tablet (10 mg total) by mouth daily.   fluconazole 150 MG  tablet Commonly known as: DIFLUCAN Take 1 tablet (150 mg total) by mouth once for 1 dose. Started by: Felix Pacini, DO   Humira Pen 40 MG/0.4ML Pnkt Generic drug: Adalimumab 40mg    metroNIDAZOLE 500 MG tablet Commonly known as: FLAGYL Take 1 tablet (500 mg total) by mouth 2 (two) times daily.   valACYclovir 500 MG tablet Commonly known as: Valtrex Take 1 tablet (500 mg total) by mouth 2 (two) times daily.   Vitamin D 50 MCG (2000 UT) Caps Take 1 capsule by mouth daily.        All past medical history, surgical history, allergies, family history, immunizations andmedications were updated in the EMR today and reviewed under the history and medication portions of their EMR.     Review of Systems  Constitutional:  Positive for malaise/fatigue. Negative for chills and fever.  HENT:  Positive for congestion and ear pain. Negative for ear discharge, sinus pain and sore throat.   Eyes:  Negative for pain, discharge and redness.  Respiratory:  Negative for cough, sputum production, shortness of breath and wheezing.   Gastrointestinal:  Negative for abdominal pain, diarrhea, nausea and vomiting.  Neurological:  Negative for dizziness and headaches.   Negative, with the exception of above mentioned in HPI   Objective:  BP 124/82   Pulse 91   Temp 98.3 F (36.8 C) (Oral)   Ht 5\' 5"  (1.651 m)   Wt 163 lb (73.9 kg)   LMP 09/05/2022 (Exact Date) Comment: lasted 5-6 days; light to heavy period  SpO2 98%   BMI 27.12 kg/m  Body mass index is 27.12 kg/m. Physical Exam Vitals and nursing note reviewed.  Constitutional:      General: She is not in acute distress.    Appearance: Normal appearance. She is normal weight. She is not ill-appearing or toxic-appearing.  HENT:     Head: Normocephalic and atraumatic.     Right Ear: Tympanic membrane and ear canal normal.     Left Ear: Ear canal normal.     Nose: Congestion and rhinorrhea present.     Mouth/Throat:     Mouth: Mucous  membranes are moist.     Pharynx: Posterior oropharyngeal erythema present. No oropharyngeal exudate.     Comments: Post nasal drip Eyes:     Extraocular Movements: Extraocular movements  intact.     Conjunctiva/sclera: Conjunctivae normal.     Pupils: Pupils are equal, round, and reactive to light.  Cardiovascular:     Rate and Rhythm: Normal rate and regular rhythm.  Pulmonary:     Effort: Pulmonary effort is normal. No respiratory distress.     Breath sounds: Normal breath sounds. No wheezing, rhonchi or rales.  Musculoskeletal:     Cervical back: Neck supple. Tenderness present.  Lymphadenopathy:     Cervical: Cervical adenopathy present.  Skin:    Findings: No rash.  Neurological:     Mental Status: She is alert and oriented to person, place, and time. Mental status is at baseline.  Psychiatric:        Mood and Affect: Mood normal.        Behavior: Behavior normal.        Thought Content: Thought content normal.        Judgment: Judgment normal.     No results found. No results found. Results for orders placed or performed in visit on 10/09/22 (from the past 24 hour(s))  POCT Influenza A/B     Status: Normal   Collection Time: 10/09/22  3:59 PM  Result Value Ref Range   Influenza A, POC Negative Negative   Influenza B, POC Negative Negative  POC COVID-19 BinaxNow     Status: Normal   Collection Time: 10/09/22  4:00 PM  Result Value Ref Range   SARS Coronavirus 2 Ag Negative Negative    Assessment/Plan: Crystal Hutchinson is a 41 y.o. female present for OV for  Nasal congestion/Acute otitis media, unspecified otitis media type - POCT Influenza A/B - POC COVID-19 BinaxNow Rest, hydrate.  +/- flonase, mucinex (DM if cough), nettie pot or nasal saline.  Augmentin twice daily prescribed, take until completed.  If cough present it can last up to 6-8 weeks.  F/U 2 weeks of not improved.    Reviewed expectations re: course of current medical issues. Discussed  self-management of symptoms. Outlined signs and symptoms indicating need for more acute intervention. Patient verbalized understanding and all questions were answered. Patient received an After-Visit Summary.    Orders Placed This Encounter  Procedures   POCT Influenza A/B   POC COVID-19 BinaxNow   Meds ordered this encounter  Medications   amoxicillin-clavulanate (AUGMENTIN) 875-125 MG tablet    Sig: Take 1 tablet by mouth 2 (two) times daily.    Dispense:  20 tablet    Refill:  0   fluconazole (DIFLUCAN) 150 MG tablet    Sig: Take 1 tablet (150 mg total) by mouth once for 1 dose.    Dispense:  1 tablet    Refill:  0   Referral Orders  No referral(s) requested today     Note is dictated utilizing voice recognition software. Although note has been proof read prior to signing, occasional typographical errors still can be missed. If any questions arise, please do not hesitate to call for verification.   electronically signed by:  Felix Pacini, DO  [facility] [place]

## 2022-10-09 NOTE — Patient Instructions (Signed)
No follow-ups on file.        Great to see you today.  I have refilled the medication(s) we provide.   If labs were collected, we will inform you of lab results once received either by echart message or telephone call.   - echart message- for normal results that have been seen by the patient already.   - telephone call: abnormal results or if patient has not viewed results in their echart.  Otitis Media, Adult  Otitis media is a condition in which the middle ear is red and swollen (inflamed) and full of fluid. The middle ear is the part of the ear that contains bones for hearing as well as air that helps send sounds to the brain. The condition usually goes away on its own. What are the causes? This condition is caused by a blockage in the eustachian tube. This tube connects the middle ear to the back of the nose. It normally allows air into the middle ear. The blockage is caused by fluid or swelling. Problems that can cause blockage include: A cold or infection that affects the nose, mouth, or throat. Allergies. An irritant, such as tobacco smoke. Adenoids that have become large. The adenoids are soft tissue located in the back of the throat, behind the nose and the roof of the mouth. Growth or swelling in the upper part of the throat, just behind the nose (nasopharynx). Damage to the ear caused by a change in pressure. This is called barotrauma. What increases the risk? You are more likely to develop this condition if you: Smoke or are exposed to tobacco smoke. Have an opening in the roof of your mouth (cleft palate). Have acid reflux. Have problems in your body's defense system (immune system). What are the signs or symptoms? Symptoms of this condition include: Ear pain. Fever. Problems with hearing. Being tired. Fluid leaking from the ear. Ringing in the ear. How is this treated? This condition can go away on its own within 3-5 days. But if the condition is caused by germs  (bacteria) and does not go away on its own, or if it keeps coming back, your doctor may: Give you antibiotic medicines. Give you medicines for pain. Follow these instructions at home: Take over-the-counter and prescription medicines only as told by your doctor. If you were prescribed an antibiotic medicine, take it as told by your doctor. Do not stop taking it even if you start to feel better. Keep all follow-up visits. Contact a doctor if: You have bleeding from your nose. There is a lump on your neck. You are not feeling better in 5 days. You feel worse instead of better. Get help right away if: You have pain that is not helped with medicine. You have swelling, redness, or pain around your ear. You get a stiff neck. You cannot move part of your face (paralysis). You notice that the bone behind your ear hurts when you touch it. You get a very bad headache. Summary Otitis media means that the middle ear is red, swollen, and full of fluid. This condition usually goes away on its own. If the problem does not go away, treatment may be needed. You may be given medicines to treat the infection or to treat your pain. If you were prescribed an antibiotic medicine, take it as told by your doctor. Do not stop taking it even if you start to feel better. Keep all follow-up visits. This information is not intended to replace advice given  to you by your health care provider. Make sure you discuss any questions you have with your health care provider. Document Revised: 01/16/2021 Document Reviewed: 01/16/2021 Elsevier Patient Education  2023 [employer].

## 2022-11-08 DIAGNOSIS — M0579 Rheumatoid arthritis with rheumatoid factor of multiple sites without organ or systems involvement: Secondary | ICD-10-CM | POA: Diagnosis not present

## 2022-11-08 DIAGNOSIS — M3500 Sicca syndrome, unspecified: Secondary | ICD-10-CM | POA: Diagnosis not present

## 2022-11-22 ENCOUNTER — Encounter: Payer: Self-pay | Admitting: Obstetrics and Gynecology

## 2022-11-22 ENCOUNTER — Other Ambulatory Visit: Payer: Self-pay

## 2022-11-22 DIAGNOSIS — B009 Herpesviral infection, unspecified: Secondary | ICD-10-CM

## 2022-11-22 MED ORDER — VALACYCLOVIR HCL 500 MG PO TABS: 500.0000 mg | ORAL_TABLET | Freq: Two times a day (BID) | ORAL | 10 refills | Status: DC

## 2022-12-05 ENCOUNTER — Encounter: Payer: Self-pay | Admitting: Family Medicine

## 2022-12-07 ENCOUNTER — Ambulatory Visit: Payer: BC Managed Care – PPO | Admitting: Family Medicine

## 2022-12-07 ENCOUNTER — Encounter: Payer: Self-pay | Admitting: Family Medicine

## 2022-12-07 ENCOUNTER — Other Ambulatory Visit (HOSPITAL_COMMUNITY)
Admission: RE | Admit: 2022-12-07 | Discharge: 2022-12-07 | Disposition: A | Discharge status: Home / Self Care | Payer: BC Managed Care – PPO | Source: Ambulatory Visit | Attending: Family Medicine | Admitting: Family Medicine

## 2022-12-07 VITALS — BP 131/84 | HR 90 | Temp 98.2°F | Wt 164.8 lb

## 2022-12-07 DIAGNOSIS — N898 Other specified noninflammatory disorders of vagina: Secondary | ICD-10-CM | POA: Diagnosis not present

## 2022-12-07 MED ORDER — FLUCONAZOLE 150 MG PO TABS: 150.0000 mg | ORAL_TABLET | Freq: Once | ORAL | 0 refills | Status: AC

## 2022-12-07 NOTE — Progress Notes (Signed)
Crystal Hutchinson , 09/14/1981, 42 y.o., female MRN: [id number] Patient Care Team    Relationship Specialty Notifications Start End  Crystal Hillock, DO PCP - General Family Medicine  02/13/21   Crystal Basil, MD Consulting Physician Obstetrics and Gynecology  02/12/22   Rheumatology, [facility]  Rheumatology  02/12/22     Chief Complaint  Patient presents with   Vaginal Discharge    1 week     Subjective: Pt presents for an OV with complaints of vaginal discharge and irritation of 1 week duration. Patient's last menstrual period was 11/22/2022. She was treated with abx for ear infection and also saw her gyn with BV infection, treated.  Discharge is white, creamy and watery at times. Sometimes has a mild odor.     09/28/2022   10:17 AM 11/28/2021    8:32 AM 06/12/2021    2:42 PM 03/30/2021    4:34 PM 10/31/2020    3:05 PM  Depression screen PHQ 2/9  Decreased Interest 1 0 0 1 0  Down, Depressed, Hopeless 0 0 0 0 0  PHQ - 2 Score 1 0 0 1 0  Altered sleeping 2  0 2 0  Tired, decreased energy 1  0 2 0  Change in appetite 0  0 2 0  Feeling bad or failure about yourself  0  0 0 0  Trouble concentrating 0  0 0 0  Moving slowly or fidgety/restless 0  0 0 0  Suicidal thoughts 0  0 0 0  PHQ-9 Score 4  0 7 0  Difficult doing work/chores     Not difficult at all    Allergies  Allergen Reactions   Bactrim Ds [Sulfamethoxazole-Trimethoprim] Anaphylaxis and Rash   Social History   Social History Narrative   Marital status/children/pets: single.    Education/employment: Employed, education and occupation questionnaire  not completed by pt.     Safety:      -Wears a bicycle helmet riding a bike: Yes     -smoke alarm in the home:Yes     - wears seatbelt: Yes     - Feels safe in their relationships: Yes   Past Medical History:  Diagnosis Date   Abnormal Pap smear of cervix    Allergy    Blood in stool    DDD (degenerative disc disease), lumbar 2017   L4-L5 minimal  retrolisthesis with shallow, noncompressive disc displacement or disc bulge.  L5-S1 degenerative disc disease broad-based left paracentral disc protrusion noted disc herniation with minimal caudal migration and associated peripheral annular tear with abutment of the ventral medial aspect of the proximal left descending S1 nerve root   Encounter for IUD removal 03/30/2021   Epigastric pain 03/01/2021   History of UTI    Hx of migraines    Levoscoliosis 2015   "mild"   Regurgitation of food 03/01/2021   Rheumatoid arthritis ([place]) 2016   positive ANA 1: 10240 and rheumatoid factor  160.   Past Surgical History:  Procedure Laterality Date   CESAREAN SECTION  2009, 2012   X2   FRACTURE SURGERY     LYMPHADENECTOMY     Procedure: Left L5-S1 transforaminal epidural steroid injection with fluoroscopy  02/06/2016   Family History  Problem Relation Age of Onset   Rheum arthritis Mother        Significant   Fibromyalgia Mother    Arthritis Father    Heart attack Maternal Grandmother 14   Diabetes Maternal  Grandmother    Healthy Brother    Colon cancer Neg Hx    Colon polyps Neg Hx    Allergies as of 12/07/2022       Reactions   Bactrim Ds [sulfamethoxazole-trimethoprim] Anaphylaxis, Rash        Medication List        Accurate as of December 07, 2022  3:40 PM. If you have any questions, ask your nurse or doctor.          amoxicillin-clavulanate 875-125 MG tablet Commonly known as: AUGMENTIN Take 1 tablet by mouth 2 (two) times daily.   cetirizine 10 MG tablet Commonly known as: ZYRTEC Take 1 tablet (10 mg total) by mouth daily.   Humira (2 Pen) 40 MG/0.4ML Pnkt Generic drug: Adalimumab 31m   metroNIDAZOLE 500 MG tablet Commonly known as: FLAGYL Take 1 tablet (500 mg total) by mouth 2 (two) times daily.   valACYclovir 500 MG tablet Commonly known as: Valtrex Take 1 tablet (500 mg total) by mouth 2 (two) times daily.   Vitamin D 50 MCG (2000 UT) Caps Take 1 capsule  by mouth daily.        All past medical history, surgical history, allergies, family history, immunizations andmedications were updated in the EMR today and reviewed under the history and medication portions of their EMR.     ROS Negative, with the exception of above mentioned in HPI   Objective:  BP 131/84   Pulse 90   Temp 98.2 F (36.8 C)   Wt 164 lb 12.8 oz (74.8 kg)   LMP 11/22/2022   SpO2 98%   BMI 27.42 kg/m  Body mass index is 27.42 kg/m. Physical Exam Vitals and nursing note reviewed.  Constitutional:      General: She is not in acute distress.    Appearance: Normal appearance. She is normal weight. She is not ill-appearing or toxic-appearing.  HENT:     Head: Normocephalic and atraumatic.  Eyes:     General: No scleral icterus.       Right eye: No discharge.        Left eye: No discharge.     Extraocular Movements: Extraocular movements intact.     Conjunctiva/sclera: Conjunctivae normal.     Pupils: Pupils are equal, round, and reactive to light.  Skin:    Findings: No rash.  Neurological:     Mental Status: She is alert and oriented to person, place, and time. Mental status is at baseline.     Motor: No weakness.     Coordination: Coordination normal.     Gait: Gait normal.  Psychiatric:        Mood and Affect: Mood normal.        Behavior: Behavior normal.        Thought Content: Thought content normal.        Judgment: Judgment normal.      No results found. No results found. No results found for this or any previous visit (from the past 24 hour(s)).  Assessment/Plan: AKeoni Nieuwenhuisis a 42y.o. female present for OV for  Vaginal discharge/Vaginal irritation - Urine cytology ancillary only([place]) - elected to tx w/ diflucan x1 while we wait on cytology. Most likely mild yeast infection s/p abx vs BV reoccurrences. F/u prn  Reviewed expectations re: course of current medical issues. Discussed self-management of symptoms. Outlined  signs and symptoms indicating need for more acute intervention. Patient verbalized understanding and all questions were answered. Patient received an  After-Visit Summary.    No orders of the defined types were placed in this encounter.  No orders of the defined types were placed in this encounter.  Referral Orders  No referral(s) requested today     Note is dictated utilizing voice recognition software. Although note has been proof read prior to signing, occasional typographical errors still can be missed. If any questions arise, please do not hesitate to call for verification.   electronically signed by:  Howard Pouch, DO  [place]

## 2022-12-11 ENCOUNTER — Encounter: Payer: Self-pay | Admitting: Family Medicine

## 2022-12-11 LAB — URINE CYTOLOGY ANCILLARY ONLY
Bacterial Vaginitis-Urine: NEGATIVE
Candida Urine: NEGATIVE
Chlamydia: NEGATIVE
Comment: NEGATIVE
Comment: NEGATIVE
Comment: NORMAL
Neisseria Gonorrhea: NEGATIVE
Trichomonas: NEGATIVE

## 2022-12-19 ENCOUNTER — Ambulatory Visit: Payer: BC Managed Care – PPO | Admitting: Family Medicine

## 2022-12-19 ENCOUNTER — Encounter: Payer: Self-pay | Admitting: Family Medicine

## 2022-12-19 VITALS — BP 126/84 | HR 89 | Temp 98.8°F | Wt 164.0 lb

## 2022-12-19 DIAGNOSIS — R051 Acute cough: Secondary | ICD-10-CM

## 2022-12-19 DIAGNOSIS — J029 Acute pharyngitis, unspecified: Secondary | ICD-10-CM

## 2022-12-19 DIAGNOSIS — D849 Immunodeficiency, unspecified: Secondary | ICD-10-CM

## 2022-12-19 LAB — POCT INFLUENZA A/B
Influenza A, POC: NEGATIVE
Influenza B, POC: NEGATIVE

## 2022-12-19 LAB — POC COVID19 BINAXNOW: SARS Coronavirus 2 Ag: POSITIVE — AB

## 2022-12-19 LAB — POCT RAPID STREP A (OFFICE): Rapid Strep A Screen: NEGATIVE

## 2022-12-19 MED ORDER — NIRMATRELVIR/RITONAVIR (PAXLOVID)TABLET: 3.0000 | ORAL_TABLET | Freq: Two times a day (BID) | ORAL | 0 refills | Status: AC

## 2022-12-19 MED ORDER — AMOXICILLIN-POT CLAVULANATE 875-125 MG PO TABS: 1.0000 | ORAL_TABLET | Freq: Two times a day (BID) | ORAL | 0 refills | Status: DC

## 2022-12-19 NOTE — Patient Instructions (Signed)
No follow-ups on file.        Great to see you today.  I have refilled the medication(s) we provide.   If labs were collected, we will inform you of lab results once received either by echart message or telephone call.   - echart message- for normal results that have been seen by the patient already.   - telephone call: abnormal results or if patient has not viewed results in their echart.  

## 2022-12-19 NOTE — Progress Notes (Signed)
Crystal Hutchinson , [birth year], 42 y.o., female MRN: [id number] Patient Care Team    Relationship Specialty Notifications Start End  Ma Hillock, DO PCP - General Family Medicine  02/13/21   Griffin Basil, MD Consulting Physician Obstetrics and Gynecology  02/12/22   Rheumatology, [facility]  Rheumatology  02/12/22     Chief Complaint  Patient presents with   Sore Throat    2-3 days; congestion   Ear Pain    Right ear; started today.     Subjective: Crystal Hutchinson is a 42 y.o. Pt presents for an OV with complaints of sore throat and right ear pain of 3 days duration.  Associated symptoms include nasal congestion and runny nose.  She reports many people in her office have been sick over hte last week, but they were not tested for covid to her knowledge.  She has not had covid in the past.  She is prescribed Humira. Pt has tried nothing to ease their symptoms.      09/28/2022   10:17 AM 11/28/2021    8:32 AM 06/12/2021    2:42 PM 03/30/2021    4:34 PM 10/31/2020    3:05 PM  Depression screen PHQ 2/9  Decreased Interest 1 0 0 1 0  Down, Depressed, Hopeless 0 0 0 0 0  PHQ - 2 Score 1 0 0 1 0  Altered sleeping 2  0 2 0  Tired, decreased energy 1  0 2 0  Change in appetite 0  0 2 0  Feeling bad or failure about yourself  0  0 0 0  Trouble concentrating 0  0 0 0  Moving slowly or fidgety/restless 0  0 0 0  Suicidal thoughts 0  0 0 0  PHQ-9 Score 4  0 7 0  Difficult doing work/chores     Not difficult at all    Allergies  Allergen Reactions   Bactrim Ds [Sulfamethoxazole-Trimethoprim] Anaphylaxis and Rash   Social History   Social History Narrative   Marital status/children/pets: single.    Education/employment: Employed, education and occupation questionnaire  not completed by pt.     Safety:      -Wears a bicycle helmet riding a bike: Yes     -smoke alarm in the home:Yes     - wears seatbelt: Yes     - Feels safe in their relationships: Yes   Past Medical History:   Diagnosis Date   Abnormal Pap smear of cervix    Allergy    Blood in stool    DDD (degenerative disc disease), lumbar 2017   L4-L5 minimal retrolisthesis with shallow, noncompressive disc displacement or disc bulge.  L5-S1 degenerative disc disease broad-based left paracentral disc protrusion noted disc herniation with minimal caudal migration and associated peripheral annular tear with abutment of the ventral medial aspect of the proximal left descending S1 nerve root   Encounter for IUD removal 03/30/2021   Epigastric pain 03/01/2021   History of UTI    Hx of migraines    Levoscoliosis 2015   "mild"   Regurgitation of food 03/01/2021   Rheumatoid arthritis ([place]) 2016   positive ANA 1: 10240 and rheumatoid factor  160.   Past Surgical History:  Procedure Laterality Date   CESAREAN SECTION  2009, 2012   X2   FRACTURE SURGERY     LYMPHADENECTOMY     Procedure: Left L5-S1 transforaminal epidural steroid injection with fluoroscopy  02/06/2016   Family History  Problem Relation Age of Onset   Rheum arthritis Mother        Significant   Fibromyalgia Mother    Arthritis Father    Heart attack Maternal Grandmother 73   Diabetes Maternal Grandmother    Healthy Brother    Colon cancer Neg Hx    Colon polyps Neg Hx    Allergies as of 12/19/2022       Reactions   Bactrim Ds [sulfamethoxazole-trimethoprim] Anaphylaxis, Rash        Medication List        Accurate as of December 19, 2022  2:59 PM. If you have any questions, ask your nurse or doctor.          STOP taking these medications    metroNIDAZOLE 500 MG tablet Commonly known as: FLAGYL Stopped by: Howard Pouch, DO       TAKE these medications    amoxicillin-clavulanate 875-125 MG tablet Commonly known as: AUGMENTIN Take 1 tablet by mouth 2 (two) times daily.   cetirizine 10 MG tablet Commonly known as: ZYRTEC Take 1 tablet (10 mg total) by mouth daily.   Humira (2 Pen) 40 MG/0.4ML Pnkt Generic drug:  Adalimumab '40mg'$    nirmatrelvir/ritonavir 20 x 150 MG & 10 x '100MG'$  Tabs Commonly known as: PAXLOVID Take 3 tablets by mouth 2 (two) times daily for 5 days. (Take nirmatrelvir 150 mg two tablets twice daily for 5 days and ritonavir 100 mg one tablet twice daily for 5 days) Patient GFR is good Started by: Howard Pouch, DO   valACYclovir 500 MG tablet Commonly known as: Valtrex Take 1 tablet (500 mg total) by mouth 2 (two) times daily.   Vitamin D 50 MCG (2000 UT) Caps Take 1 capsule by mouth daily.        All past medical history, surgical history, allergies, family history, immunizations andmedications were updated in the EMR today and reviewed under the history and medication portions of their EMR.     Review of Systems  Constitutional:  Positive for malaise/fatigue.  HENT:  Positive for congestion, ear pain and sore throat. Negative for sinus pain.   Eyes: Negative.   Cardiovascular: Negative.   Gastrointestinal:  Negative for constipation, diarrhea, nausea and vomiting.  Genitourinary: Negative.   Musculoskeletal: Negative.   Skin:  Negative for rash.  Neurological: Negative.  Negative for dizziness and headaches.  Endo/Heme/Allergies: Negative.   Psychiatric/Behavioral: Negative.     Negative, with the exception of above mentioned in HPI   Objective:  BP 126/84   Pulse 89   Temp 98.8 F (37.1 C)   Wt 164 lb (74.4 kg)   LMP 11/22/2022   SpO2 98%   BMI 27.29 kg/m  Body mass index is 27.29 kg/m. Physical Exam Vitals and nursing note reviewed.  Constitutional:      General: She is not in acute distress.    Appearance: Normal appearance. She is normal weight. She is not ill-appearing or toxic-appearing.  HENT:     Head: Normocephalic and atraumatic.     Comments: Bilateral effusions of TM, no erythema    Right Ear: Tympanic membrane, ear canal and external ear normal. There is no impacted cerumen.     Left Ear: Tympanic membrane and ear canal normal. There is no  impacted cerumen.     Nose: Congestion and rhinorrhea present.     Mouth/Throat:     Mouth: Mucous membranes are moist.     Pharynx: No oropharyngeal exudate or posterior oropharyngeal erythema.  Comments: PND present Eyes:     General: No scleral icterus.       Right eye: No discharge.        Left eye: No discharge.     Extraocular Movements: Extraocular movements intact.     Conjunctiva/sclera: Conjunctivae normal.     Pupils: Pupils are equal, round, and reactive to light.  Cardiovascular:     Rate and Rhythm: Normal rate and regular rhythm.     Heart sounds: No murmur heard. Pulmonary:     Effort: Pulmonary effort is normal. No respiratory distress.     Breath sounds: Normal breath sounds. No wheezing, rhonchi or rales.  Musculoskeletal:     Cervical back: Neck supple.  Skin:    Findings: No rash.  Neurological:     Mental Status: She is alert and oriented to person, place, and time. Mental status is at baseline.     Motor: No weakness.     Coordination: Coordination normal.     Gait: Gait normal.  Psychiatric:        Mood and Affect: Mood normal.        Behavior: Behavior normal.        Thought Content: Thought content normal.        Judgment: Judgment normal.      No results found. No results found. Results for orders placed or performed in visit on 12/19/22 (from the past 24 hour(s))  POC COVID-19 BinaxNow     Status: Abnormal   Collection Time: 12/19/22  2:54 PM  Result Value Ref Range   SARS Coronavirus 2 Ag Positive (A) Negative  POCT Influenza A/B     Status: None   Collection Time: 12/19/22  2:54 PM  Result Value Ref Range   Influenza A, POC Negative Negative   Influenza B, POC Negative Negative  POCT rapid strep A     Status: None   Collection Time: 12/19/22  2:54 PM  Result Value Ref Range   Rapid Strep A Screen Negative Negative    Assessment/Plan: Crystal Hutchinson is a 42 y.o. female present for OV for  COVID positive/Sore  throat/Immunocompromised ([place]) - POC COVID-19 BinaxNow> positive - POCT Influenza A/B> negative Rest, hydrate.  mucinex (DM if cough), nettie pot or nasal saline.  Paxlovid  prescribed, take until completed.  Augmentin BID to start over the weekend if symptoms progress or no improving d/t Higher risk.  F/U 2 weeks if not improved.  Reviewed home care instructions for COVID. Advised self-isolation at home for at least 5 days. After 5 days, if improved and fever resolved, can be in public, but should wear a mask around others for an additional 5 days. If symptoms, esp, dyspnea develops/worsens, recommend in-person evaluation at either an urgent care or the emergency room.   Reviewed expectations re: course of current medical issues. Discussed self-management of symptoms. Outlined signs and symptoms indicating need for more acute intervention. Patient verbalized understanding and all questions were answered. Patient received an After-Visit Summary.    Orders Placed This Encounter  Procedures   POC COVID-19 BinaxNow   POCT Influenza A/B   POCT rapid strep A   Meds ordered this encounter  Medications   amoxicillin-clavulanate (AUGMENTIN) 875-125 MG tablet    Sig: Take 1 tablet by mouth 2 (two) times daily.    Dispense:  20 tablet    Refill:  0   nirmatrelvir/ritonavir (PAXLOVID) 20 x 150 MG & 10 x '100MG'$  TABS    Sig: Take 3  tablets by mouth 2 (two) times daily for 5 days. (Take nirmatrelvir 150 mg two tablets twice daily for 5 days and ritonavir 100 mg one tablet twice daily for 5 days) Patient GFR is good    Dispense:  30 tablet    Refill:  0   Referral Orders  No referral(s) requested today     Note is dictated utilizing voice recognition software. Although note has been proof read prior to signing, occasional typographical errors still can be missed. If any questions arise, please do not hesitate to call for verification.   electronically signed by:  Howard Pouch, DO   [place]

## 2023-02-14 ENCOUNTER — Ambulatory Visit (INDEPENDENT_AMBULATORY_CARE_PROVIDER_SITE_OTHER): Payer: BC Managed Care – PPO | Admitting: Family Medicine

## 2023-02-14 ENCOUNTER — Encounter: Payer: Self-pay | Admitting: Family Medicine

## 2023-02-14 VITALS — BP 117/80 | HR 74 | Temp 98.3°F | Ht 65.35 in | Wt 164.2 lb

## 2023-02-14 DIAGNOSIS — Z8249 Family history of ischemic heart disease and other diseases of the circulatory system: Secondary | ICD-10-CM

## 2023-02-14 DIAGNOSIS — Z131 Encounter for screening for diabetes mellitus: Secondary | ICD-10-CM

## 2023-02-14 DIAGNOSIS — Z1231 Encounter for screening mammogram for malignant neoplasm of breast: Secondary | ICD-10-CM

## 2023-02-14 DIAGNOSIS — Z0000 Encounter for general adult medical examination without abnormal findings: Secondary | ICD-10-CM

## 2023-02-14 LAB — COMPREHENSIVE METABOLIC PANEL
ALT: 15 U/L (ref 0–35)
AST: 15 U/L (ref 0–37)
Albumin: 4.5 g/dL (ref 3.5–5.2)
Alkaline Phosphatase: 51 U/L (ref 39–117)
BUN: 11 mg/dL (ref 6–23)
CO2: 27 mEq/L (ref 19–32)
Calcium: 9.4 mg/dL (ref 8.4–10.5)
Chloride: 102 mEq/L (ref 96–112)
Creatinine, Ser: 0.66 mg/dL (ref 0.40–1.20)
GFR: 106.95 mL/min (ref >60.00)
Glucose, Bld: 91 mg/dL (ref 70–99)
Potassium: 3.9 mEq/L (ref 3.5–5.1)
Sodium: 136 mEq/L (ref 135–145)
Total Bilirubin: 0.5 mg/dL (ref 0.2–1.2)
Total Protein: 8.1 g/dL (ref 6.0–8.3)

## 2023-02-14 LAB — CBC WITH DIFFERENTIAL/PLATELET
Basophils Absolute: 0 10*3/uL (ref 0.0–0.1)
Basophils Relative: 1.7 % (ref 0.0–3.0)
Eosinophils Absolute: 0.1 10*3/uL (ref 0.0–0.7)
Eosinophils Relative: 4.3 % (ref 0.0–5.0)
HCT: 36.8 % (ref 36.0–46.0)
Hemoglobin: 12.7 g/dL (ref 12.0–15.0)
Lymphocytes Relative: 41.1 % (ref 12.0–46.0)
Lymphs Abs: 1.1 10*3/uL (ref 0.7–4.0)
MCHC: 34.5 g/dL (ref 30.0–36.0)
MCV: 89.4 fl (ref 78.0–100.0)
Monocytes Absolute: 0.3 10*3/uL (ref 0.1–1.0)
Monocytes Relative: 9.5 % (ref 3.0–12.0)
Neutro Abs: 1.2 10*3/uL — ABNORMAL LOW (ref 1.4–7.7)
Neutrophils Relative %: 43.4 % (ref 43.0–77.0)
Platelets: 270 10*3/uL (ref 150.0–400.0)
RBC: 4.11 Mil/uL (ref 3.87–5.11)
RDW: 13.1 % (ref 11.5–15.5)
WBC: 2.8 10*3/uL — ABNORMAL LOW (ref 4.0–10.5)

## 2023-02-14 LAB — TSH: TSH: 1.6 u[IU]/mL (ref 0.35–5.50)

## 2023-02-14 LAB — HEMOGLOBIN A1C: Hgb A1c MFr Bld: 5.1 % (ref 4.6–6.5)

## 2023-02-14 NOTE — Progress Notes (Signed)
Patient ID: Crystal Hutchinson, female  DOB: 12-Apr-1981, 42 y.o.   MRN: [id number] Patient Care Team    Relationship Specialty Notifications Start End  Natalia Leatherwood, DO PCP - General Family Medicine  02/13/21   Warden Fillers, MD Consulting Physician Obstetrics and Gynecology  02/12/22   Rheumatology, [facility]  Rheumatology  02/12/22     Chief Complaint  Patient presents with   Annual Exam    Pt is not fasting   Subjective: Crystal Hutchinson is a 42 y.o.  Female  present for CPE. All past medical history, surgical history, allergies, family history, immunizations, medications and social history were updated in the electronic medical record today. All recent labs, ED visits and hospitalizations within the last year were reviewed.  Health maintenance:  Colonoscopy: no fhx. Routine screen at 43 Mammogram: no fhx. Completed 02/27/2022 [facility]> ordered 2024 Cervical cancer screening: last pap: 09/28/2022 (ASCUS-neg HPV), completed by: GYN- [facility] Immunizations: tdap UTD 01/2022, Influenza UTD 2023 (encouraged yearly) Infectious disease screening: HIV completed during pregnancy, Hep C  screen completed DEXA: consider early screen with RA history- has rheum Patient has a Dental home. Hospitalizations/ED visits: reviewed     09/28/2022   10:17 AM 11/28/2021    8:32 AM 06/12/2021    2:42 PM 03/30/2021    4:34 PM 10/31/2020    3:05 PM  Depression screen PHQ 2/9  Decreased Interest 1 0 0 1 0  Down, Depressed, Hopeless 0 0 0 0 0  PHQ - 2 Score 1 0 0 1 0  Altered sleeping 2  0 2 0  Tired, decreased energy 1  0 2 0  Change in appetite 0  0 2 0  Feeling bad or failure about yourself  0  0 0 0  Trouble concentrating 0  0 0 0  Moving slowly or fidgety/restless 0  0 0 0  Suicidal thoughts 0  0 0 0  PHQ-9 Score 4  0 7 0  Difficult doing work/chores     Not difficult at all      09/28/2022   10:17 AM 06/12/2021    2:42 PM 03/30/2021    4:34 PM  GAD 7 : Generalized Anxiety Score  Nervous,  Anxious, on Edge 0 0 0  Control/stop worrying 0 0 0  Worry too much - different things 0 0 1  Trouble relaxing 0 0 0  Restless 1 0 0  Easily annoyed or irritable Afraid - awful might happen 0 0 0  Total GAD 7 Score Immunization History  Administered Date(s) Administered   Influenza-Unspecified 08/22/2020, 08/22/2021, 07/22/2022   PFIZER(Purple Top)SARS-COV-2 Vaccination 01/28/2020, 02/19/2020   Tdap 02/12/2022    Past Medical History:  Diagnosis Date   Bartholin's cyst 03/30/2021   Blood in stool    DDD (degenerative disc disease), lumbar 2017   L4-L5 minimal retrolisthesis with shallow, noncompressive disc displacement or disc bulge.  L5-S1 degenerative disc disease broad-based left paracentral disc protrusion noted disc herniation with minimal caudal migration and associated peripheral annular tear with abutment of the ventral medial aspect of the proximal left descending S1 nerve root   Encounter for IUD removal 03/30/2021   Epigastric pain 03/01/2021   Hx of migraines    Levoscoliosis 2015   "mild"   Low grade squamous intraepithelial lesion (LGSIL) on cervical Pap smear 06/12/2021   Regurgitation of food 03/01/2021   Rheumatoid arthritis 2016   positive  ANA 1: 10240 and rheumatoid factor  160.   Allergies  Allergen Reactions   Bactrim Ds [Sulfamethoxazole-Trimethoprim] Anaphylaxis and Rash   Past Surgical History:  Procedure Laterality Date   CESAREAN SECTION  2009, 2012   X2   FRACTURE SURGERY     LYMPHADENECTOMY     Procedure: Left L5-S1 transforaminal epidural steroid injection with fluoroscopy  02/06/2016   Family History  Problem Relation Age of Onset   Rheum arthritis Mother        Significant   Fibromyalgia Mother    Arthritis Father    Healthy Brother    Heart attack Maternal Grandmother 25   Diabetes Maternal Grandmother    Colon cancer Neg Hx    Colon polyps Neg Hx    Social History   Social History Narrative   Marital  status/children/pets: single.    Education/employment: Employed, education and occupation questionnaire  not completed by pt.     Safety:      -Wears a bicycle helmet riding a bike: Yes     -smoke alarm in the home:Yes     - wears seatbelt: Yes     - Feels safe in their relationships: Yes    Allergies as of 02/14/2023       Reactions   Bactrim Ds [sulfamethoxazole-trimethoprim] Anaphylaxis, Rash        Medication List        Accurate as of February 14, 2023  8:19 AM. If you have any questions, ask your nurse or doctor.          STOP taking these medications    amoxicillin-clavulanate 875-125 MG tablet Commonly known as: AUGMENTIN Stopped by: Felix Pacini, DO   cetirizine 10 MG tablet Commonly known as: ZYRTEC Stopped by: Felix Pacini, DO       TAKE these medications    Humira (2 Pen) 40 MG/0.4ML Pnkt Generic drug: Adalimumab 40mg    valACYclovir 500 MG tablet Commonly known as: Valtrex Take 1 tablet (500 mg total) by mouth 2 (two) times daily.   Vitamin D 50 MCG (2000 UT) Caps Take 1 capsule by mouth daily.        All past medical history, surgical history, allergies, family history, immunizations andmedications were updated in the EMR today and reviewed under the history and medication portions of their EMR.      No results found.  ROS 14 pt review of systems performed and negative (unless mentioned in an HPI) Objective: BP 117/80   Pulse 74   Temp 98.3 F (36.8 C)   Ht 5' 5.35" (1.66 m)   Wt 164 lb 3.2 oz (74.5 kg)   LMP 02/10/2023   SpO2 99%   BMI 27.03 kg/m  Physical Exam Vitals and nursing note reviewed.  Constitutional:      General: She is not in acute distress.    Appearance: Normal appearance. She is not ill-appearing or toxic-appearing.  HENT:     Head: Normocephalic and atraumatic.     Right Ear: Tympanic membrane, ear canal and external ear normal. There is no impacted cerumen.     Left Ear: Tympanic membrane, ear canal and  external ear normal. There is no impacted cerumen.     Nose: No congestion or rhinorrhea.     Mouth/Throat:     Mouth: Mucous membranes are moist.     Pharynx: Oropharynx is clear. No oropharyngeal exudate or posterior oropharyngeal erythema.  Eyes:     General: No scleral icterus.  Right eye: No discharge.        Left eye: No discharge.     Extraocular Movements: Extraocular movements intact.     Conjunctiva/sclera: Conjunctivae normal.     Pupils: Pupils are equal, round, and reactive to light.  Cardiovascular:     Rate and Rhythm: Normal rate and regular rhythm.     Pulses: Normal pulses.     Heart sounds: Normal heart sounds. No murmur heard.    No friction rub. No gallop.  Pulmonary:     Effort: Pulmonary effort is normal. No respiratory distress.     Breath sounds: Normal breath sounds. No stridor. No wheezing, rhonchi or rales.  Chest:     Chest wall: No tenderness.  Abdominal:     General: Abdomen is flat. Bowel sounds are normal. There is no distension.     Palpations: Abdomen is soft. There is no mass.     Tenderness: There is no abdominal tenderness. There is no right CVA tenderness, left CVA tenderness, guarding or rebound.     Hernia: No hernia is present.  Musculoskeletal:        General: No swelling, tenderness or deformity. Normal range of motion.     Cervical back: Normal range of motion and neck supple. No rigidity or tenderness.     Right lower leg: No edema.     Left lower leg: No edema.  Lymphadenopathy:     Cervical: No cervical adenopathy.  Skin:    General: Skin is warm and dry.     Coloration: Skin is not jaundiced or pale.     Findings: No bruising, erythema, lesion or rash.  Neurological:     General: No focal deficit present.     Mental Status: She is alert and oriented to person, place, and time. Mental status is at baseline.     Cranial Nerves: No cranial nerve deficit.     Sensory: No sensory deficit.     Motor: No weakness.      Coordination: Coordination normal.     Gait: Gait normal.     Deep Tendon Reflexes: Reflexes normal.  Psychiatric:        Mood and Affect: Mood normal.        Behavior: Behavior normal.        Thought Content: Thought content normal.        Judgment: Judgment normal.      Assessment/plan: Crystal Hutchinson is a 42 y.o. female present for CPE  Rheumatoid arthritis, involving unspecified site, unspecified whether rheumatoid factor present (HCC)/Sjogren's syndrome without extraglandular involvement (HCC) - prescribed humira by rheum  Routine general medical examination at a health care facility Patient was encouraged to exercise greater than 150 minutes a week. Patient was encouraged to choose a diet filled with fresh fruits and vegetables, and lean meats. AVS provided to patient today for education/recommendation on gender specific health and safety maintenance. Colonoscopy: no fhx. Routine screen at 43 Mammogram: no fhx. Completed 02/27/2022 [facility] ordered 2024 Cervical cancer screening: last pap: 09/28/2022 (ASCUS-neg HPV), completed by: GYN- [facility] Immunizations: tdap UTD 01/2022, Influenza UTD 2023 (encouraged yearly) Infectious disease screening: HIV completed during pregnancy, Hep C  screen completed DEXA: consider early screen with RA history- has rheum   Return in about 1 year (around 02/15/2024) for cpe (20 min).   Orders Placed This Encounter  Procedures   MM 3D SCREENING MAMMOGRAM BILATERAL BREAST   CBC with Differential/Platelet   Comprehensive metabolic panel   Hemoglobin  A1c   TSH   No orders of the defined types were placed in this encounter.  Referral Orders  No referral(s) requested today    Electronically signed by: Felix Pacini, DO [facility] [place]

## 2023-02-14 NOTE — Patient Instructions (Addendum)
Return in about 1 year (around 02/15/2024) for cpe (20 min).        Great to see you today.  I have refilled the medication(s) we provide.   If labs were collected, we will inform you of lab results once received either by echart message or telephone call.   - echart message- for normal results that have been seen by the patient already.   - telephone call: abnormal results or if patient has not viewed results in their echart.

## 2023-02-15 DIAGNOSIS — R5383 Other fatigue: Secondary | ICD-10-CM | POA: Diagnosis not present

## 2023-02-15 DIAGNOSIS — Z111 Encounter for screening for respiratory tuberculosis: Secondary | ICD-10-CM | POA: Diagnosis not present

## 2023-02-19 DIAGNOSIS — Z111 Encounter for screening for respiratory tuberculosis: Secondary | ICD-10-CM | POA: Diagnosis not present

## 2023-02-27 DIAGNOSIS — M0579 Rheumatoid arthritis with rheumatoid factor of multiple sites without organ or systems involvement: Secondary | ICD-10-CM | POA: Diagnosis not present

## 2023-02-27 DIAGNOSIS — M3501 Sicca syndrome with keratoconjunctivitis: Secondary | ICD-10-CM | POA: Diagnosis not present

## 2023-05-10 DIAGNOSIS — M0579 Rheumatoid arthritis with rheumatoid factor of multiple sites without organ or systems involvement: Secondary | ICD-10-CM | POA: Diagnosis not present

## 2023-05-10 DIAGNOSIS — M3501 Sicca syndrome with keratoconjunctivitis: Secondary | ICD-10-CM | POA: Diagnosis not present

## 2023-05-27 DIAGNOSIS — M0579 Rheumatoid arthritis with rheumatoid factor of multiple sites without organ or systems involvement: Secondary | ICD-10-CM | POA: Diagnosis not present

## 2023-06-07 IMAGING — MG MM DIGITAL SCREENING BILAT W/ TOMO AND CAD
8 series · 9 of 24 positions shown · non-contrast
Comparison: Previous exam(s).

CLINICAL DATA: Screening.

EXAM:
DIGITAL SCREENING BILATERAL MAMMOGRAM WITH TOMOSYNTHESIS AND CAD
TECHNIQUE: Bilateral screening digital craniocaudal and mediolateral oblique
mammograms were obtained. Bilateral screening digital breast
tomosynthesis was performed. The images were evaluated with
computer-aided detection.

[R CC synth-2D]
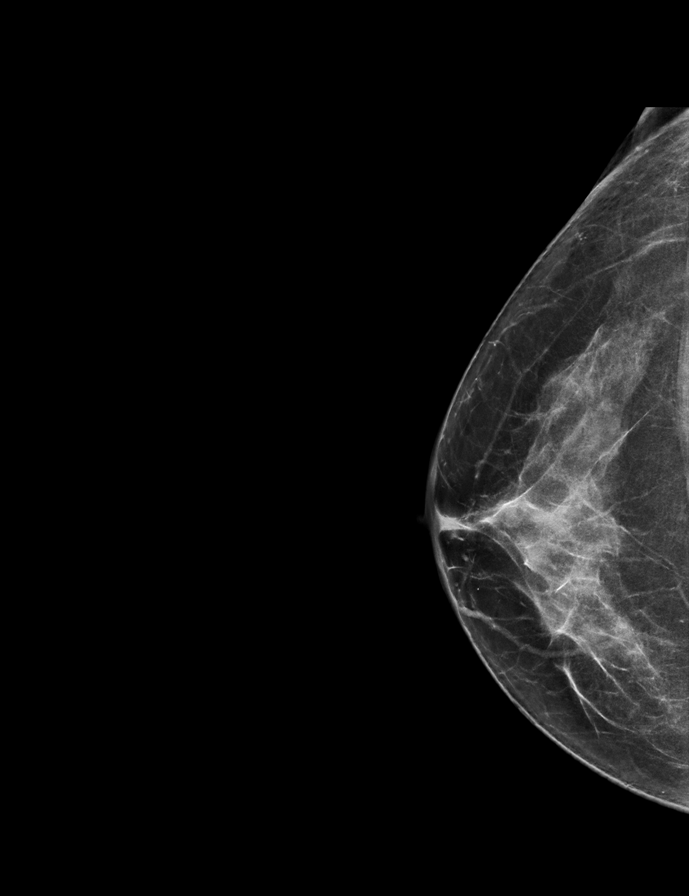

[L CC synth-2D]
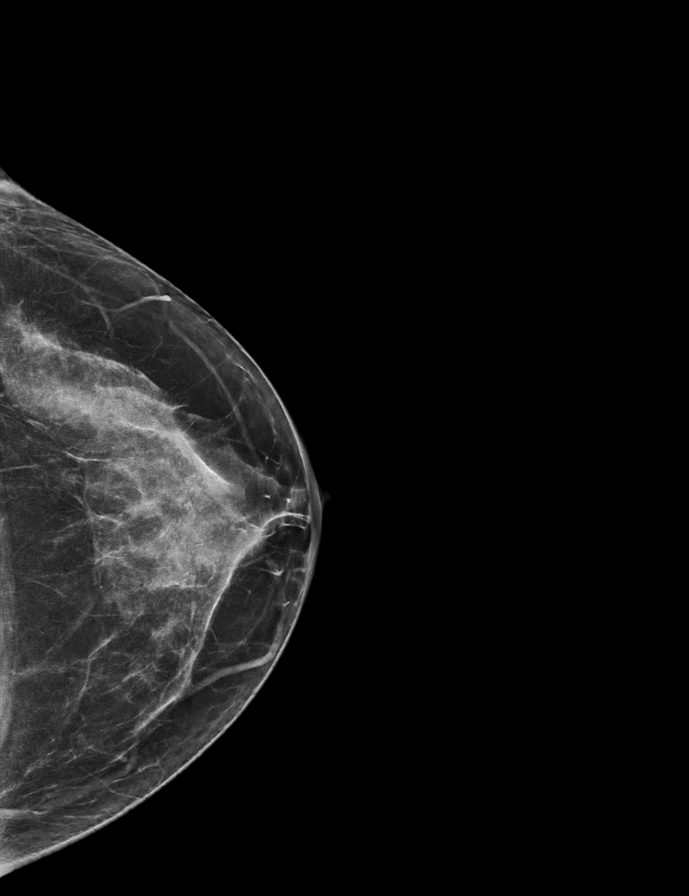

[R MLO synth-2D]
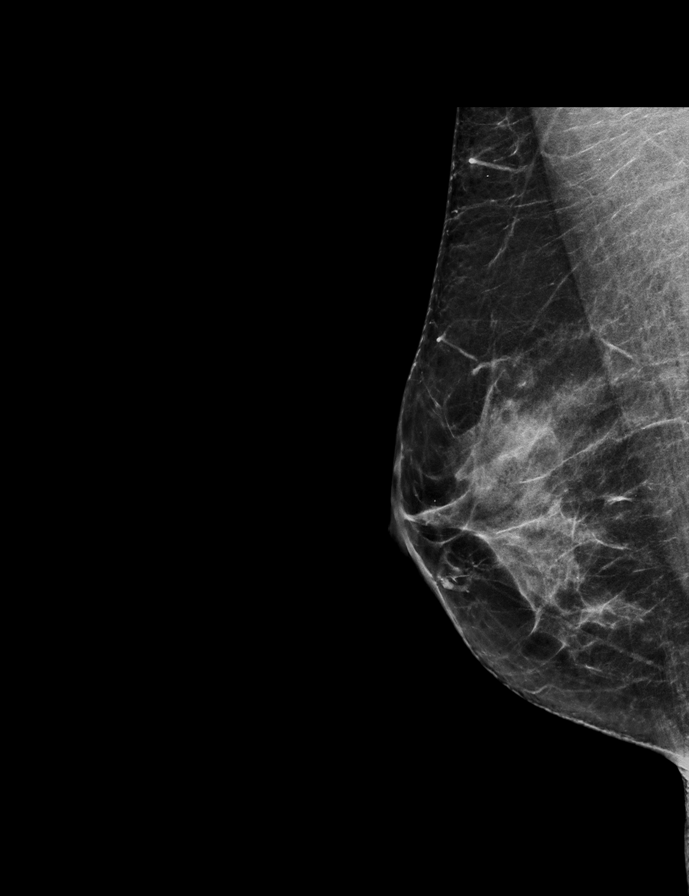

[L MLO synth-2D]
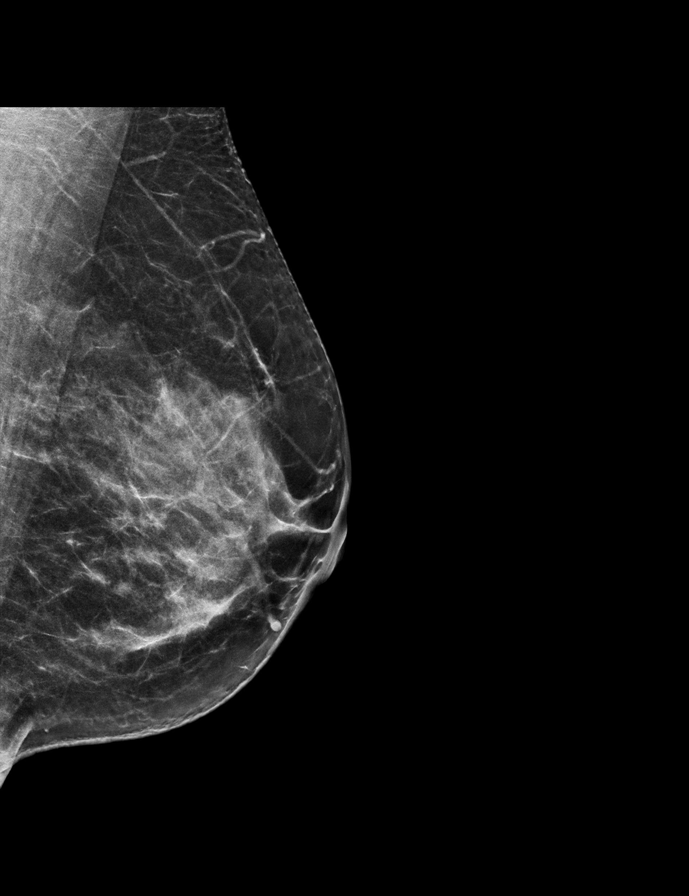

[R CC tomo · 2 of 59 frames shown]
[frame 20/59]
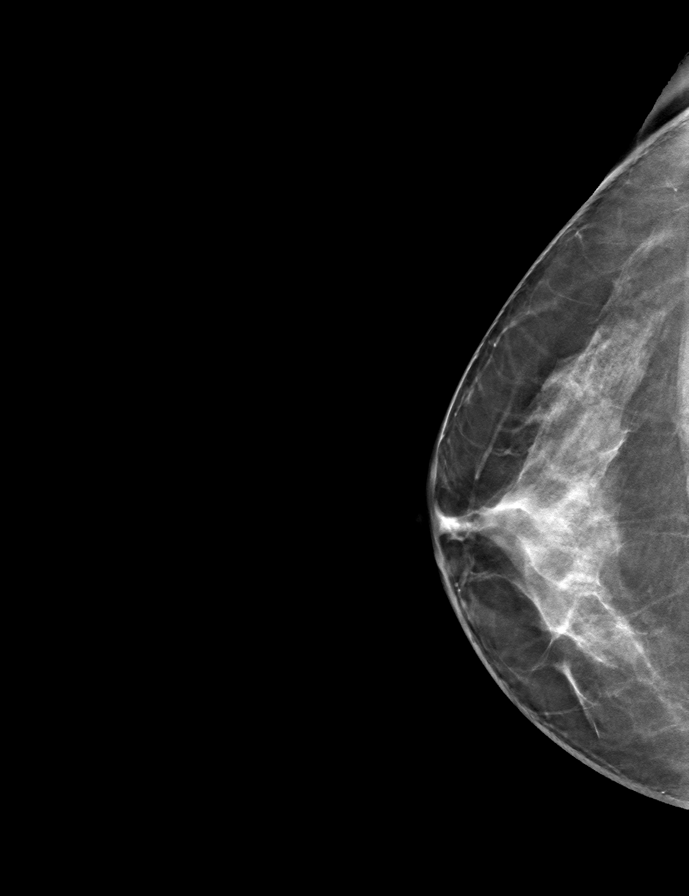
[frame 30/59]
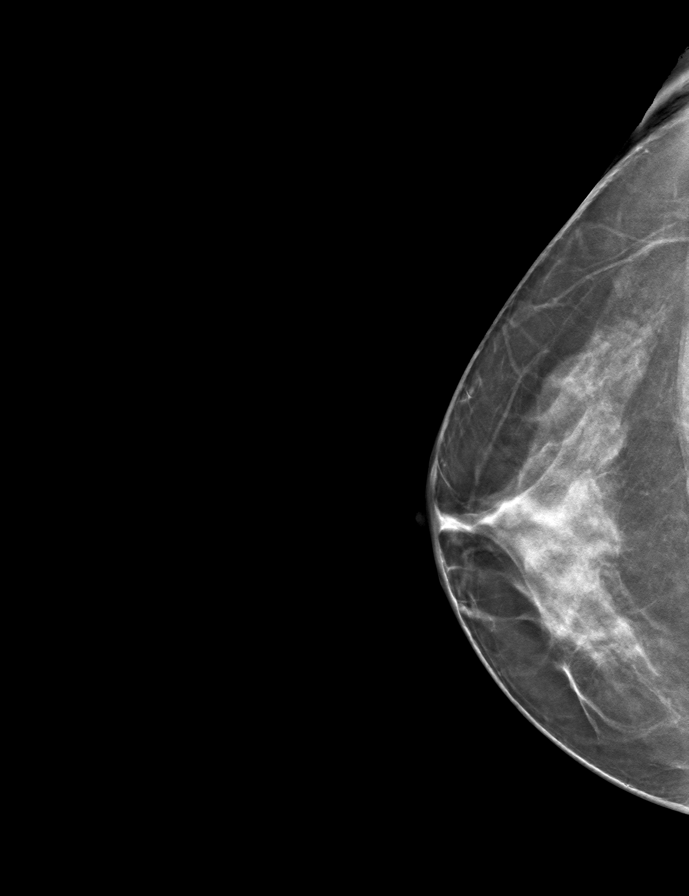

[R MLO tomo · tomo slice 29/58.0]
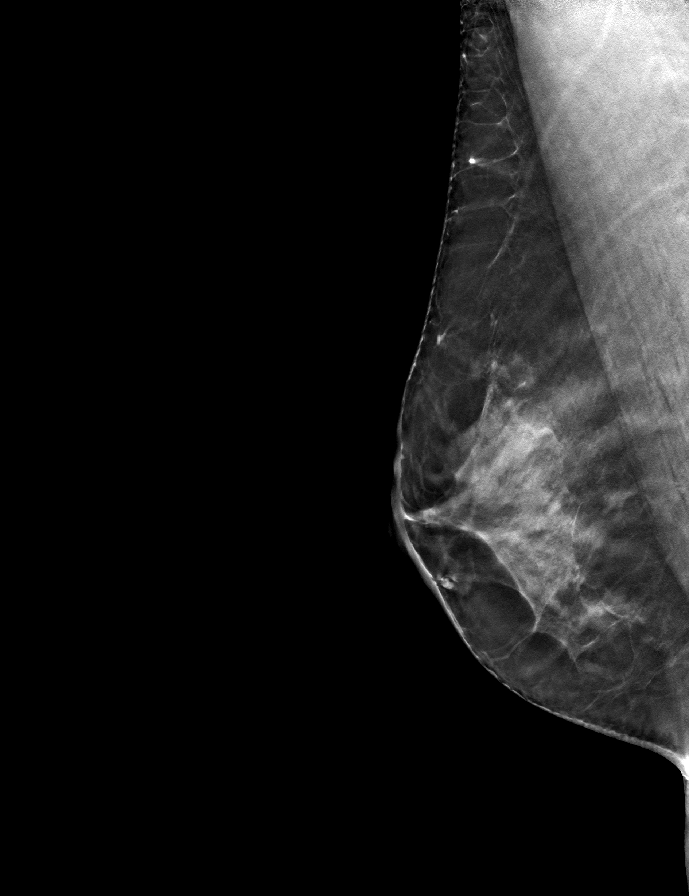

[L MLO tomo · tomo slice 29/56.0]
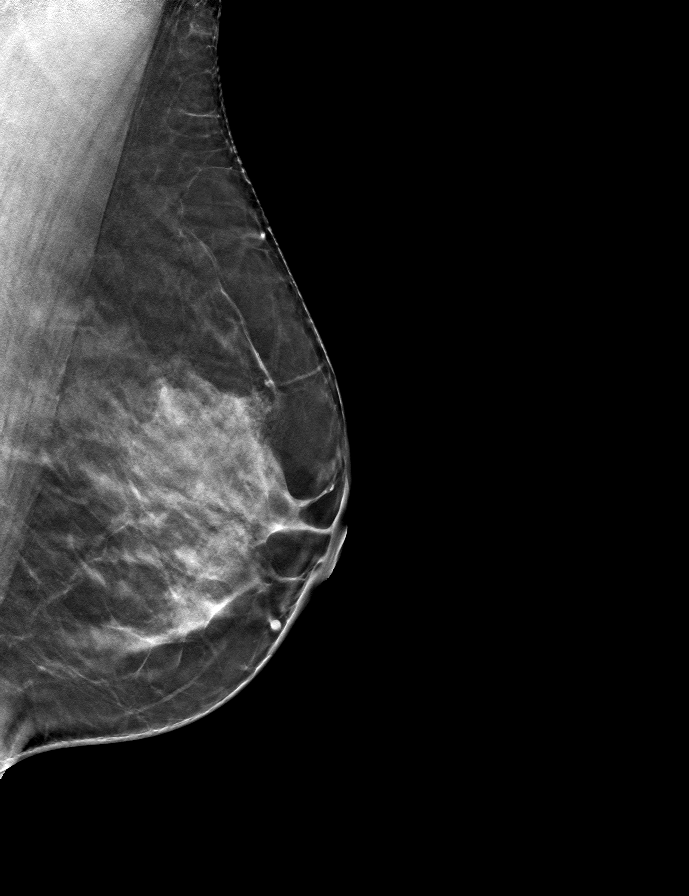

[L CC tomo · tomo slice 29/58.0]
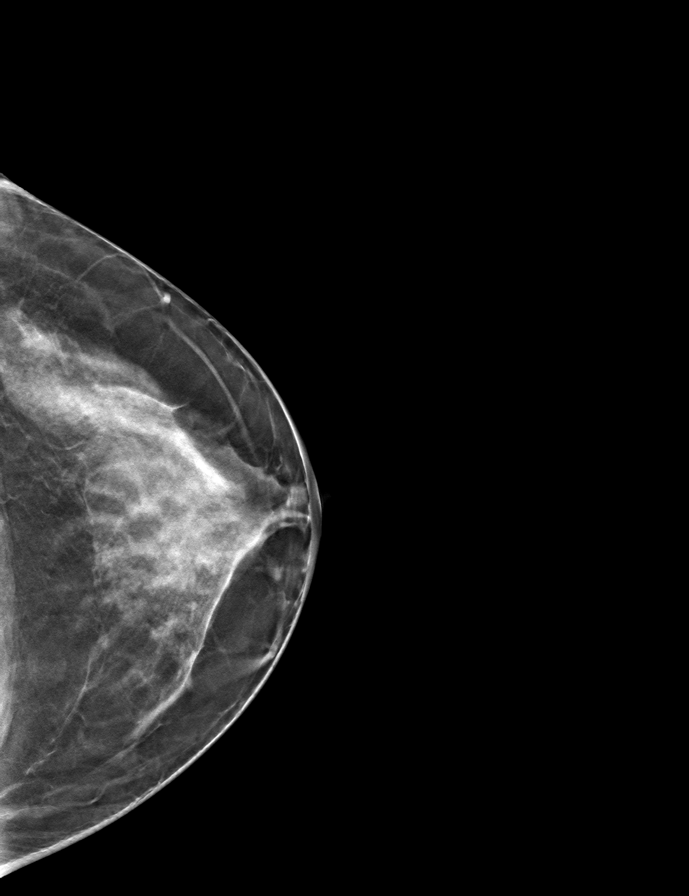

[9 of 24 positions shown; findings below may reference images not displayed]

ACR Breast Density Category c: The breast tissue is heterogeneously
dense, which may obscure small masses.
FINDINGS: There are no findings suspicious for malignancy.
IMPRESSION: No mammographic evidence of malignancy. A result letter of this
screening mammogram will be mailed directly to the patient.

RECOMMENDATION:
Screening mammogram in one year. (Code:Q3-W-BC3)

BI-RADS CATEGORY  1: Negative.

## 2023-06-10 DIAGNOSIS — M0579 Rheumatoid arthritis with rheumatoid factor of multiple sites without organ or systems involvement: Secondary | ICD-10-CM | POA: Diagnosis not present

## 2023-08-19 DIAGNOSIS — M0579 Rheumatoid arthritis with rheumatoid factor of multiple sites without organ or systems involvement: Secondary | ICD-10-CM | POA: Diagnosis not present

## 2023-08-19 DIAGNOSIS — M3501 Sicca syndrome with keratoconjunctivitis: Secondary | ICD-10-CM | POA: Diagnosis not present

## 2023-08-22 ENCOUNTER — Encounter: Payer: Self-pay | Admitting: Family Medicine

## 2023-08-27 ENCOUNTER — Encounter: Payer: Self-pay | Admitting: Family Medicine

## 2023-08-27 ENCOUNTER — Ambulatory Visit (INDEPENDENT_AMBULATORY_CARE_PROVIDER_SITE_OTHER): Payer: BC Managed Care – PPO | Admitting: Family Medicine

## 2023-08-27 VITALS — BP 118/84 | HR 87 | Temp 98.2°F | Wt 162.8 lb

## 2023-08-27 DIAGNOSIS — R197 Diarrhea, unspecified: Secondary | ICD-10-CM

## 2023-08-27 DIAGNOSIS — Z8379 Family history of other diseases of the digestive system: Secondary | ICD-10-CM

## 2023-08-27 DIAGNOSIS — M069 Rheumatoid arthritis, unspecified: Secondary | ICD-10-CM | POA: Diagnosis not present

## 2023-08-27 LAB — COMPREHENSIVE METABOLIC PANEL
ALT: 50 U/L — ABNORMAL HIGH (ref 0–35)
AST: 121 U/L — ABNORMAL HIGH (ref 0–37)
Albumin: 4.6 g/dL (ref 3.5–5.2)
Alkaline Phosphatase: 56 U/L (ref 39–117)
BUN: 8 mg/dL (ref 6–23)
CO2: 29 meq/L (ref 19–32)
Calcium: 10 mg/dL (ref 8.4–10.5)
Chloride: 103 meq/L (ref 96–112)
Creatinine, Ser: 0.74 mg/dL (ref 0.40–1.20)
GFR: 98.28 mL/min (ref >60.00)
Glucose, Bld: 87 mg/dL (ref 70–99)
Potassium: 4.8 meq/L (ref 3.5–5.1)
Sodium: 138 meq/L (ref 135–145)
Total Bilirubin: 0.6 mg/dL (ref 0.2–1.2)
Total Protein: 8.1 g/dL (ref 6.0–8.3)

## 2023-08-27 LAB — C-REACTIVE PROTEIN: CRP: <1 mg/dL (ref 0.5–20.0)

## 2023-08-27 LAB — SEDIMENTATION RATE: Sed Rate: 4 mm/h (ref 0–20)

## 2023-08-27 MED ORDER — SACCHAROMYCES BOULARDII 250 MG PO CAPS: 250.0000 mg | ORAL_CAPSULE | Freq: Two times a day (BID) | ORAL | 1 refills | Status: DC

## 2023-08-27 NOTE — Progress Notes (Signed)
Crystal Hutchinson , Mar 02, 1981, 42 y.o., female MRN: [id number] Patient Care Team    Relationship Specialty Notifications Start End  Natalia Leatherwood, DO PCP - General Family Medicine  02/13/21   Warden Fillers, MD Consulting Physician Obstetrics and Gynecology  02/12/22   Rheumatology, [facility]  Rheumatology  02/12/22     Chief Complaint  Patient presents with   GI Problem    once a month constipation; nausea, painful bloating; fatigue and discomfort after for about 2 hours; ongoing for the last year   Diarrhea    daily     Subjective: Crystal Hutchinson is a 42 y.o. Pt presents today to discuss her irregular bowel habits.  Patient reports she has constipation 1-2 times a month.  She does endorse this can fall along the time of her menstrual cycle typically.  She reports a history of endometriosis in the past.  She states when she has the constipation she has very bad cramping, bloating and feels fatigued. She reports daily she has issues with diarrhea 2-3 times every morning.  She states they are watery and sometimes yellow. She reports a colonoscopy was completed, when [place] around [place] for abdominal pain and discomfort along with bowel habit changes.  She states that the colonoscopy was normal.  No records available. She denies any fever, chills or unintentional weight loss. She reports she drinks an occasional alcoholic drink, mostly on the weekend if she is going to consume alcohol. She consumes a regular diet. Patient has a history of Sjogren's disease and rheumatoid arthritis, prescribed Humira. There is a family history of Crohn's disease in her mother.     08/27/2023    9:01 AM 02/14/2023    8:42 AM 09/28/2022   10:17 AM 11/28/2021    8:32 AM 06/12/2021    2:42 PM  Depression screen PHQ 2/9  Decreased Interest 1 0 1 0 0  Down, Depressed, Hopeless 1 0 0 0 0  PHQ - 2 Score 2 0 1 0 0  Altered sleeping 2  2  0  Tired, decreased energy 2  1  0  Change in  appetite 0  0  0  Feeling bad or failure about yourself  1  0  0  Trouble concentrating 0  0  0  Moving slowly or fidgety/restless 0  0  0  Suicidal thoughts 0  0  0  PHQ-9 Score 7  4  0  Difficult doing work/chores Somewhat difficult        Allergies  Allergen Reactions   Bactrim Ds [Sulfamethoxazole-Trimethoprim] Anaphylaxis and Rash   Social History   Social History Narrative   Marital status/children/pets: single.    Education/employment: Employed, education and occupation questionnaire  not completed by pt.     Safety:      -Wears a bicycle helmet riding a bike: Yes     -smoke alarm in the home:Yes     - wears seatbelt: Yes     - Feels safe in their relationships: Yes   Past Medical History:  Diagnosis Date   Bartholin's cyst 03/30/2021   Blood in stool    DDD (degenerative disc disease), lumbar 2017   L4-L5 minimal retrolisthesis with shallow, noncompressive disc displacement or disc bulge.  L5-S1 degenerative disc disease broad-based left paracentral disc protrusion noted disc herniation with minimal caudal migration and associated peripheral annular tear with abutment of the ventral medial aspect of the proximal left descending  S1 nerve root   Encounter for IUD removal 03/30/2021   Epigastric pain 03/01/2021   Hx of migraines    Levoscoliosis 2015   "mild"   Low grade squamous intraepithelial lesion (LGSIL) on cervical Pap smear 06/12/2021   Regurgitation of food 03/01/2021   Rheumatoid arthritis (HCC) 2016   positive ANA 1: 10240 and rheumatoid factor  160.   Past Surgical History:  Procedure Laterality Date   CESAREAN SECTION  2009, 2012   X2   FRACTURE SURGERY     LYMPHADENECTOMY     Procedure: Left L5-S1 transforaminal epidural steroid injection with fluoroscopy  02/06/2016   Family History  Problem Relation Age of Onset   Rheum arthritis Mother        Significant   Fibromyalgia Mother    Inflammatory bowel disease Mother        Crohn's   Arthritis  Father    Healthy Brother    Heart attack Maternal Grandmother 45   Diabetes Maternal Grandmother    Colon cancer Neg Hx    Colon polyps Neg Hx    Allergies as of 08/27/2023       Reactions   Bactrim Ds [sulfamethoxazole-trimethoprim] Anaphylaxis, Rash        Medication List        Accurate as of August 27, 2023 12:56 PM. If you have any questions, ask your nurse or doctor.          Humira (2 Pen) 40 MG/0.4ML pen Generic drug: adalimumab 40mg    saccharomyces boulardii 250 MG capsule Commonly known as: Florastor Take 1 capsule (250 mg total) by mouth 2 (two) times daily. Started by: Felix Pacini   valACYclovir 500 MG tablet Commonly known as: Valtrex Take 1 tablet (500 mg total) by mouth 2 (two) times daily.   Vitamin D 50 MCG (2000 UT) Caps Take 1 capsule by mouth daily.        All past medical history, surgical history, allergies, family history, immunizations andmedications were updated in the EMR today and reviewed under the history and medication portions of their EMR.     ROS Negative, with the exception of above mentioned in HPI   Objective:  BP 118/84   Pulse 87   Temp 98.2 F (36.8 C)   Wt 162 lb 12.8 oz (73.8 kg)   SpO2 98%   BMI 26.80 kg/m  Body mass index is 26.8 kg/m. Physical Exam Vitals and nursing note reviewed.  Constitutional:      General: She is not in acute distress.    Appearance: Normal appearance. She is normal weight. She is not ill-appearing or toxic-appearing.  HENT:     Head: Normocephalic and atraumatic.  Eyes:     General: No scleral icterus.       Right eye: No discharge.        Left eye: No discharge.     Extraocular Movements: Extraocular movements intact.     Conjunctiva/sclera: Conjunctivae normal.     Pupils: Pupils are equal, round, and reactive to light.  Abdominal:     General: Abdomen is flat. Bowel sounds are normal. There is no distension.     Palpations: Abdomen is soft. There is no mass.      Tenderness: There is no abdominal tenderness. There is no guarding or rebound.  Skin:    Findings: No rash.  Neurological:     Mental Status: She is alert and oriented to person, place, and time. Mental status is at baseline.  Motor: No weakness.     Coordination: Coordination normal.     Gait: Gait normal.  Psychiatric:        Mood and Affect: Mood normal.        Behavior: Behavior normal.        Thought Content: Thought content normal.        Judgment: Judgment normal.     No results found. No results found. No results found for this or any previous visit (from the past 24 hour(s)).  Assessment/Plan: Kelechi Astarita is a 42 y.o. female present for OV for  Diarrhea, unspecified type/family history of Crohn's disease Patient has a history of rheumatoid arthritis, Sjogren's disease and a family history of Crohn's disease making her more susceptible for IBD diagnoses. We discussed IBD versus IBS today in detail.  Patient was provided with copy of FODMAP diet. We also discussed gluten sensitivity and lactose sensitivity. Start Florastor twice daily Patient would like to proceed with laboratory workup for IBD. - Comp Met (CMET) - C-reactive protein - Sedimentation rate - ANCA screen with reflex titer - Celiac Disease Comprehensive Panel with Reflexes Will wait on laboratory blood results, and if appropriate for either refer to gastroenterology versus following up with patient in the office and stool studies obtained.  Reviewed expectations re: course of current medical issues. Discussed self-management of symptoms. Outlined signs and symptoms indicating need for more acute intervention. Patient verbalized understanding and all questions were answered. Patient received an After-Visit Summary.    Orders Placed This Encounter  Procedures   Comp Met (CMET)   C-reactive protein   Sedimentation rate   ANCA screen with reflex titer   Celiac Disease Comprehensive Panel with  Reflexes   Meds ordered this encounter  Medications   saccharomyces boulardii (FLORASTOR) 250 MG capsule    Sig: Take 1 capsule (250 mg total) by mouth 2 (two) times daily.    Dispense:  180 capsule    Refill:  1   Referral Orders  No referral(s) requested today     Note is dictated utilizing voice recognition software. Although note has been proof read prior to signing, occasional typographical errors still can be missed. If any questions arise, please do not hesitate to call for verification.   electronically signed by:  Felix Pacini, DO  [facility] [place]

## 2023-08-27 NOTE — Patient Instructions (Addendum)
Return in about 4 weeks (around 09/24/2023), or if symptoms worsen or fail to improve.      Great to see you today.  I have refilled the medication(s) we provide.   If labs were collected or images ordered, we will inform you of  results once we have received them and reviewed. We will contact you either by echart message, or telephone call.  Please give ample time to the testing facility, and our office to run,  receive and review results. Please do not call inquiring of results, even if you can see them in your chart. We will contact you as soon as we are able. If it has been over 1 week since the test was completed, and you have not yet heard from [place], then please call us.    - echart message- for normal results that have been seen by the patient already.   - telephone call: abnormal results or if patient has not viewed results in their echart.  If a referral to a specialist was entered for you, please call us in 2 weeks if you have not heard from the specialist office to schedule. '

## 2023-08-28 ENCOUNTER — Telehealth: Payer: Self-pay | Admitting: Family Medicine

## 2023-08-28 NOTE — Telephone Encounter (Signed)
Please call patient Her liver enzymes are elevated significantly.  This is a new finding for her. The inflammatory markers are normal The other tests are still pending  We do need to make sure we workup the elevated liver enzymes, especially since she is having diarrhea.  Please have her schedule an appointment within the next 1-2 weeks to follow-up on the elevated liver enzymes.  By then we will have the rest the lab results back as well and can move forward with the stool studies and referrals as needed.

## 2023-08-28 NOTE — Telephone Encounter (Signed)
LM for pt to return call to discuss.  

## 2023-08-29 NOTE — Telephone Encounter (Signed)
Spoke with patient regarding results/recommendations.  

## 2023-09-01 LAB — CELIAC DISEASE COMPREHENSIVE PANEL WITH REFLEXES
(tTG) Ab, IgA: 7.2 U/mL
Immunoglobulin A: 75 mg/dL (ref 47–310)

## 2023-09-01 LAB — ANCA SCREEN W REFLEX TITER: ANCA SCREEN: NEGATIVE

## 2023-09-05 ENCOUNTER — Ambulatory Visit (INDEPENDENT_AMBULATORY_CARE_PROVIDER_SITE_OTHER): Payer: BC Managed Care – PPO | Admitting: Family Medicine

## 2023-09-05 ENCOUNTER — Encounter: Payer: Self-pay | Admitting: Family Medicine

## 2023-09-05 VITALS — BP 150/98 | HR 67 | Temp 97.6°F | Wt 162.6 lb

## 2023-09-05 DIAGNOSIS — R1010 Upper abdominal pain, unspecified: Secondary | ICD-10-CM

## 2023-09-05 DIAGNOSIS — R197 Diarrhea, unspecified: Secondary | ICD-10-CM

## 2023-09-05 DIAGNOSIS — R7989 Other specified abnormal findings of blood chemistry: Secondary | ICD-10-CM

## 2023-09-05 LAB — COMPREHENSIVE METABOLIC PANEL
ALT: 26 U/L (ref 0–35)
AST: 22 U/L (ref 0–37)
Albumin: 4.9 g/dL (ref 3.5–5.2)
Alkaline Phosphatase: 57 U/L (ref 39–117)
BUN: 10 mg/dL (ref 6–23)
CO2: 26 meq/L (ref 19–32)
Calcium: 9.8 mg/dL (ref 8.4–10.5)
Chloride: 101 meq/L (ref 96–112)
Creatinine, Ser: 0.75 mg/dL (ref 0.40–1.20)
GFR: 96.69 mL/min (ref >60.00)
Glucose, Bld: 88 mg/dL (ref 70–99)
Potassium: 3.8 meq/L (ref 3.5–5.1)
Sodium: 134 meq/L — ABNORMAL LOW (ref 135–145)
Total Bilirubin: 0.7 mg/dL (ref 0.2–1.2)
Total Protein: 8.4 g/dL — ABNORMAL HIGH (ref 6.0–8.3)

## 2023-09-05 LAB — LIPASE: Lipase: 53 U/L (ref 11.0–59.0)

## 2023-09-05 NOTE — Patient Instructions (Signed)
Return if symptoms worsen or fail to improve. Once results received we will discuss next follow up timeline       Great to see you today.  I have refilled the medication(s) we provide.   If labs were collected or images ordered, we will inform you of  results once we have received them and reviewed. We will contact you either by echart message, or telephone call.  Please give ample time to the testing facility, and our office to run,  receive and review results. Please do not call inquiring of results, even if you can see them in your chart. We will contact you as soon as we are able. If it has been over 1 week since the test was completed, and you have not yet heard from [place], then please call us.    - echart message- for normal results that have been seen by the patient already.   - telephone call: abnormal results or if patient has not viewed results in their echart.  If a referral to a specialist was entered for you, please call us in 2 weeks if you have not heard from the specialist office to schedule.

## 2023-09-05 NOTE — Progress Notes (Signed)
Crystal Hutchinson , Feb 24, 1981, 42 y.o., female MRN: [id number] Patient Care Team    Relationship Specialty Notifications Start End  Natalia Leatherwood, DO PCP - General Family Medicine  02/13/21   Warden Fillers, MD Consulting Physician Obstetrics and Gynecology  02/12/22   Rheumatology, [facility]  Rheumatology  02/12/22     Chief Complaint  Patient presents with   Diarrhea    Elevated LFT     Subjective: Crystal Hutchinson is a 42 y.o. Pt presents today to discuss abnormal LFT on work for her chronic diarrhea of unknown cause.  Patient reports she has cut out all dairy in her diet since seeing [place] last.  She denies any nausea or vomiting.  Diarrhea has continued, except she has noticed a change since starting the Florastor.  She thinks it may be changing her stools to more soft than watery.  Prior note: discuss her irregular bowel habits.  Patient reports she has constipation 1-2 times a month.  She does endorse this can fall along the time of her menstrual cycle typically.  She reports a history of endometriosis in the past.  She states when she has the constipation she has very bad cramping, bloating and feels fatigued. She reports daily she has issues with diarrhea 2-3 times every morning.  She states they are watery and sometimes yellow. She reports a colonoscopy was completed, [place] for abdominal pain and discomfort along with bowel habit changes.  She states that the colonoscopy was normal.  No records available. She denies any fever, chills or unintentional weight loss. She reports she drinks an occasional alcoholic drink, mostly on the weekend if she is going to consume alcohol. She consumes a regular diet. Patient has a history of Sjogren's disease and rheumatoid arthritis, prescribed Humira. There is a family history of Crohn's disease in her mother.     08/27/2023    9:01 AM 02/14/2023    8:42 AM 09/28/2022   10:17 AM 11/28/2021    8:32 AM  06/12/2021    2:42 PM  Depression screen PHQ 2/9  Decreased Interest 1 0 1 0 0  Down, Depressed, Hopeless 1 0 0 0 0  PHQ - 2 Score 2 0 1 0 0  Altered sleeping 2  2  0  Tired, decreased energy 2  1  0  Change in appetite 0  0  0  Feeling bad or failure about yourself  1  0  0  Trouble concentrating 0  0  0  Moving slowly or fidgety/restless 0  0  0  Suicidal thoughts 0  0  0  PHQ-9 Score 7  4  0  Difficult doing work/chores Somewhat difficult        Allergies  Allergen Reactions   Bactrim Ds [Sulfamethoxazole-Trimethoprim] Anaphylaxis and Rash   Social History   Social History Narrative   Marital status/children/pets: single.    Education/employment: Employed, education and occupation questionnaire  not completed by pt.     Safety:      -Wears a bicycle helmet riding a bike: Yes     -smoke alarm in the home:Yes     - wears seatbelt: Yes     - Feels safe in their relationships: Yes   Past Medical History:  Diagnosis Date   Bartholin's cyst 03/30/2021   Blood in stool    DDD (degenerative disc disease), lumbar 2017   L4-L5 minimal retrolisthesis with shallow, noncompressive  disc displacement or disc bulge.  L5-S1 degenerative disc disease broad-based left paracentral disc protrusion noted disc herniation with minimal caudal migration and associated peripheral annular tear with abutment of the ventral medial aspect of the proximal left descending S1 nerve root   Encounter for IUD removal 03/30/2021   Epigastric pain 03/01/2021   Hx of migraines    Levoscoliosis 2015   "mild"   Low grade squamous intraepithelial lesion (LGSIL) on cervical Pap smear 06/12/2021   Regurgitation of food 03/01/2021   Rheumatoid arthritis (HCC) 2016   positive ANA 1: 10240 and rheumatoid factor  160.   Past Surgical History:  Procedure Laterality Date   CESAREAN SECTION  2009, 2012   X2   FRACTURE SURGERY     LYMPHADENECTOMY     Procedure: Left L5-S1 transforaminal epidural steroid injection  with fluoroscopy  02/06/2016   Family History  Problem Relation Age of Onset   Rheum arthritis Mother        Significant   Fibromyalgia Mother    Inflammatory bowel disease Mother        Crohn's   Arthritis Father    Healthy Brother    Heart attack Maternal Grandmother 7   Diabetes Maternal Grandmother    Colon cancer Neg Hx    Colon polyps Neg Hx    Allergies as of 09/05/2023       Reactions   Bactrim Ds [sulfamethoxazole-trimethoprim] Anaphylaxis, Rash        Medication List        Accurate as of September 05, 2023 11:59 PM. If you have any questions, ask your nurse or doctor.          Humira (2 Pen) 40 MG/0.4ML pen Generic drug: adalimumab 40mg    saccharomyces boulardii 250 MG capsule Commonly known as: Florastor Take 1 capsule (250 mg total) by mouth 2 (two) times daily.   valACYclovir 500 MG tablet Commonly known as: Valtrex Take 1 tablet (500 mg total) by mouth 2 (two) times daily.   Vitamin D 50 MCG (2000 UT) Caps Take 1 capsule by mouth daily.        All past medical history, surgical history, allergies, family history, immunizations andmedications were updated in the EMR today and reviewed under the history and medication portions of their EMR.     ROS Negative, with the exception of above mentioned in HPI   Objective:  BP (!) 150/98   Pulse 67   Temp 97.6 F (36.4 C)   Wt 162 lb 9.6 oz (73.8 kg)   LMP 08/12/2023   SpO2 100%   BMI 26.77 kg/m  Body mass index is 26.77 kg/m. Physical Exam Vitals and nursing note reviewed.  Constitutional:      General: She is not in acute distress.    Appearance: Normal appearance. She is normal weight. She is not ill-appearing or toxic-appearing.  HENT:     Head: Normocephalic and atraumatic.  Eyes:     General: No scleral icterus.       Right eye: No discharge.        Left eye: No discharge.     Extraocular Movements: Extraocular movements intact.     Conjunctiva/sclera: Conjunctivae normal.      Pupils: Pupils are equal, round, and reactive to light.  Cardiovascular:     Rate and Rhythm: Normal rate and regular rhythm.  Abdominal:     General: Abdomen is flat. Bowel sounds are normal. There is no distension.     Palpations:  Abdomen is soft. There is no mass.     Tenderness: There is no abdominal tenderness. There is no guarding or rebound.  Musculoskeletal:     Right lower leg: No edema.     Left lower leg: No edema.  Skin:    Findings: No rash.  Neurological:     Mental Status: She is alert and oriented to person, place, and time. Mental status is at baseline.     Motor: No weakness.     Coordination: Coordination normal.     Gait: Gait normal.  Psychiatric:        Mood and Affect: Mood normal.        Behavior: Behavior normal.        Thought Content: Thought content normal.        Judgment: Judgment normal.     No results found. No results found. No results found for this or any previous visit (from the past 24 hour(s)).  Assessment/Plan: Crystal Hutchinson is a 42 y.o. female present for OV for  Diarrhea, unspecified type/family history of Crohn's disease Patient has a history of rheumatoid arthritis, Sjogren's disease and a family history of Crohn's disease making her more susceptible for IBD diagnoses. We discussed IBD versus IBS today in detail.  Patient was provided with copy of FODMAP diet.>  She has been following the FODMAP diet for the last couple weeks.  And avoiding dairy. Continue Florastor, she may be seeing a positive sense of change with starting CRP, ESR, ANCA and celiac panels were all normal. She did have a rather significant elevation in her LFTs.  Elevated LFTs: - Comp Met (CMET) repeated today -ANA with reflex panel -Stool studies, C. difficile, fecal lactoferrin, fecal fat, fecal elastase -Abdominal ultrasound ordered -Discussed referral to gastroenterology if appropriate after results.   Reviewed expectations re: course of current medical  issues. Discussed self-management of symptoms. Outlined signs and symptoms indicating need for more acute intervention. Patient verbalized understanding and all questions were answered. Patient received an After-Visit Summary.    Orders Placed This Encounter  Procedures   Clostridium Difficile by PCR([facility])   Fecal Fat, Qualitative   Stool Culture   US Abdomen Complete   Lipase   ANA, IFA Comprehensive Panel-([facility])   Anti-smooth muscle antibody, IgG   Comp Met (CMET)   Fecal lactoferrin, quant   Anti-nuclear ab-titer (ANA titer)   Pancreatic Elastase, Fecal   No orders of the defined types were placed in this encounter.  Referral Orders  No referral(s) requested today     Note is dictated utilizing voice recognition software. Although note has been proof read prior to signing, occasional typographical errors still can be missed. If any questions arise, please do not hesitate to call for verification.   electronically signed by:  Felix Pacini, DO  [facility] [place]

## 2023-09-09 ENCOUNTER — Telehealth: Payer: Self-pay | Admitting: Family Medicine

## 2023-09-09 ENCOUNTER — Other Ambulatory Visit: Payer: BC Managed Care – PPO

## 2023-09-09 DIAGNOSIS — R1010 Upper abdominal pain, unspecified: Secondary | ICD-10-CM

## 2023-09-09 DIAGNOSIS — M3500 Sicca syndrome, unspecified: Secondary | ICD-10-CM

## 2023-09-09 DIAGNOSIS — R197 Diarrhea, unspecified: Secondary | ICD-10-CM | POA: Diagnosis not present

## 2023-09-09 DIAGNOSIS — R7989 Other specified abnormal findings of blood chemistry: Secondary | ICD-10-CM

## 2023-09-09 DIAGNOSIS — Z8379 Family history of other diseases of the digestive system: Secondary | ICD-10-CM

## 2023-09-09 NOTE — Telephone Encounter (Signed)
Please call patient Liver enzymes are now normal which is good his. Her autoimmune panel is significantly positive, with a positive Ro/La, which is a test for Sjogren's disease. She is already established with rheumatology.  I would encourage her to make an appointment with them to discuss new findings of Sjogren's disease.  Sjogren syndrome (SS) is an autoimmune disease that may affect the gastrointestinal system, from the mouth, esophagus, and bowel to the liver and pancreas.  Therefore some of her symptoms could be explained by her Sjogren's syndrome.  I also placed a referral to gastroenterology for her to discuss Sjogren's syndrome and effects on the GI track.  They may want to do early studies/EGD/colonoscopy to further evaluate.  I would encourage her to continue with the changes we have made thus far and also complete the stool studies if she has not already.

## 2023-09-09 NOTE — Telephone Encounter (Signed)
Spoke with patient regarding results/recommendations.  

## 2023-09-10 LAB — ANTI-NUCLEAR AB-TITER (ANA TITER)
ANA TITER: 1:40 {titer} — ABNORMAL HIGH
ANA Titer 1: 1:1280 {titer} — ABNORMAL HIGH

## 2023-09-10 LAB — ANA, IFA COMPREHENSIVE PANEL
Anti Nuclear Antibody (ANA): POSITIVE — AB
ENA SM Ab Ser-aCnc: <1 AI
SM/RNP: <1 AI
SSA (Ro) (ENA) Antibody, IgG: >8 AI — AB
SSB (La) (ENA) Antibody, IgG: >8 AI — AB
Scleroderma (Scl-70) (ENA) Antibody, IgG: <1 AI
ds DNA Ab: 1 [IU]/mL

## 2023-09-10 LAB — CLOSTRIDIUM DIFFICILE BY PCR: Toxigenic C. Difficile by PCR: NEGATIVE

## 2023-09-10 LAB — ANTI-SMOOTH MUSCLE ANTIBODY, IGG: Actin (Smooth Muscle) Antibody (IGG): 23 U — ABNORMAL HIGH (ref <20)

## 2023-09-11 NOTE — Telephone Encounter (Signed)
No further action needed.

## 2023-09-13 ENCOUNTER — Encounter: Payer: Self-pay | Admitting: Family Medicine

## 2023-09-14 LAB — STOOL CULTURE: E coli, Shiga toxin Assay: NEGATIVE

## 2023-09-16 LAB — FECAL LACTOFERRIN, QUANT
Fecal Lactoferrin: NEGATIVE
MICRO NUMBER:: 15744127
SPECIMEN QUALITY:: ADEQUATE

## 2023-09-16 LAB — FECAL FAT, QUALITATIVE: FECAL FAT, QUALITATIVE: NORMAL

## 2023-09-16 LAB — PANCREATIC ELASTASE, FECAL: Pancreatic Elastase-1, Stool: >800 ug/g (ref >200)

## 2023-09-30 ENCOUNTER — Encounter: Payer: Self-pay | Admitting: Physician Assistant

## 2023-10-04 NOTE — Progress Notes (Unsigned)
10/07/2023 Crystal Hutchinson [id number] Aug 14, 1981  Referring provider: Natalia Leatherwood, DO Primary GI doctor: Dr. Myrtie Neither  ASSESSMENT AND PLAN:  Diarrhea with family history of crohn's  Reports daily diarrhea, 2-5 times per day, with occasional constipation. No associated abdominal pain. Get Sed rate, CRP Check celiac panel Can do trial of IBGARD daily Add on citracel/benefiber FODMAP,  and lifestyle changes discussed Will schedule for endoscopic evaluation, discussed with patient and agrees with plan. We have discussed the risks of bleeding, infection, perforation, medication reactions, and remote risk of death associated with colonoscopy. All questions were answered and the patient acknowledges these risk and wishes to proceed.  Consider SIBO testing or xifaxin trial pending results possible component of pelvis floor dysfunction with history and symptoms. , Can refer to pelvic floor PT  GERD with dysphagia Schedule EGD with dilatation to evaluate for stenosis, tumor, erosive/infectious esophagititis, and EOE.   If the EGD is negative and symptoms continue after PPI trial, can consider barium swallow  I discussed risks of EGD with patient today, including risk of sedation, bleeding or perforation.  Patient provides understanding and gave verbal consent to proceed. In the interim patient advised about swallowing precautions.  Eat slowly, chew food well before swallowing.  Drink liquids in between each bite to avoid food impaction. Declines medications, will take pending EGD  Elevated liver function Resolved with recheck Slight elevation of antismooth muscle at 23 Restarted tumeric and humira prior to LFTs which could be contributing No ABX, rare ETOH Pending AB [place], some mild RUQ pain Recheck LFTs, GGT, IGG, AMA, ASMA, celiac Had previously negative Hep C and Hep B with RA.  Rheumatoid arthritis On humira, had 9 month without, restarted 07/2023 Well  controlled  Endometriosis Could be contributing to bowel symptoms Consider pelvic floor   Patient Care Team: Natalia Leatherwood, DO as PCP - General (Family Medicine) Warden Fillers, MD as Consulting Physician (Obstetrics and Gynecology) Rheumatology, [facility] (Rheumatology)  HISTORY OF PRESENT ILLNESS: 42 y.o. female with a past medical history of rheumatoid arthritis on Humira, Sjogren's, HSV and others listed below presents for evaluation of diarrhea with family history of Crohn's disease in her mother.  Patient's had irregular bowel habits can coincide with menstrual period history of endometriosis.   Has had mix of constipation and diarrhea. States had previous colonoscopy around 2003 that was unremarkable no records available. 09/05/2023 C. difficile negative lactic ferritin unremarkable fecal fat negative pancreatic elastase unremarkable lipase normal stool culture negative Patient had elevated LFTs about a month ago AST 121, ALT 50 Recheck LFTs unremarkable with AST 22, ALT 26 and elevated alk phos or bilirubin. Patient had positive ANA with elevated titer 1: 1280 smooth muscle antibody +23 Pending abdominal ultrasound  Discussed the use of AI scribe software for clinical note transcription with the patient, who gave verbal consent to proceed.  History of Present Illness   The patient, with a history of rheumatoid arthritis and Sjogren's syndrome, is currently on Humira (40mg ). She reports that the medication controls her joint pain well and she has not experienced any rashes. She also takes turmeric, which she restarted two months ago after a nine-month break. The patient had a nine-month hiatus from Humira due to insurance issues but has been back on it since September. She had a slight elevation in liver function about a month ago, but it has since come down. She denies taking any Aleve, ibuprofen, or antibiotics leading up to the liver function test.  The patient's primary  concern is constant diarrhea, which she has been experiencing for about a year. She reports that she initially noticed constipation right before her menstrual period, which has now become a twice-monthly occurrence. She describes the pain during these episodes as severe, leading to sweating and exhaustion. She also reports that she usually has diarrhea immediately after a normal bowel movement. The patient has bowel movements every day, usually diarrhea, ranging from two to five times a day. She has been taking a probiotic, which she reports can cause constipation if taken more than two to three days consecutively.  The patient also reports occasional difficulty swallowing, particularly with bread and pork, which has been ongoing for a couple of years. She describes the sensation as food getting stuck in her throat, which sometimes leads to vomiting. She denies any issues with swallowing pills.  The patient has a family history of Crohn's disease and reports occasional heartburn, usually a couple of times a week around mid-afternoon. She also reports occasional feelings of heaviness in the pelvic area, but denies any nocturnal symptoms or nausea. She has noticed hemorrhoids in the past couple of months, which she describes as a feeling rather than through rectal bleeding or pain. She also reports occasional abdominal pain, particularly when constipated.      She  reports that she quit smoking about 16 years ago. Her smoking use included cigarettes. She has never used smokeless tobacco. She reports current alcohol use of about 6.0 standard drinks of alcohol per week. She reports that she does not use drugs.  RELEVANT LABS AND IMAGING:  Results   LABS Liver function: elevated (08/2023) Smooth muscle antibody: borderline elevated (08/2023)      CBC    Component Value Date/Time   WBC 2.8 (L) 02/14/2023 0803   RBC 4.11 02/14/2023 0803   HGB 12.7 02/14/2023 0803   HCT 36.8 02/14/2023 0803   PLT  270.0 02/14/2023 0803   MCV 89.4 02/14/2023 0803   MCHC 34.5 02/14/2023 0803   RDW 13.1 02/14/2023 0803   LYMPHSABS 1.1 02/14/2023 0803   MONOABS 0.3 02/14/2023 0803   EOSABS 0.1 02/14/2023 0803   BASOSABS 0.0 02/14/2023 0803   Recent Labs    02/14/23 0803  HGB 12.7    CMP     Component Value Date/Time   NA 134 (L) 09/05/2023 0845   NA 134 (A) 03/13/2021 0000   K 3.8 09/05/2023 0845   CL 101 09/05/2023 0845   CO2 26 09/05/2023 0845   GLUCOSE 88 09/05/2023 0845   BUN 10 09/05/2023 0845   BUN 7 03/13/2021 0000   CREATININE 0.75 09/05/2023 0845   CALCIUM 9.8 09/05/2023 0845   PROT 8.4 (H) 09/05/2023 0845   ALBUMIN 4.9 09/05/2023 0845   AST 22 09/05/2023 0845   ALT 26 09/05/2023 0845   ALKPHOS 57 09/05/2023 0845   BILITOT 0.7 09/05/2023 0845   GFRNONAA 109 03/13/2021 0000      Latest Ref Rng & Units 09/05/2023    8:45 AM 08/27/2023    9:25 AM 02/14/2023    8:03 AM  Hepatic Function  Total Protein 6.0 - 8.3 g/dL 8.4  8.1  8.1   Albumin 3.5 - 5.2 g/dL 4.9  4.6  4.5   AST 0 - 37 U/L 22  121  15   ALT 0 - 35 U/L 26  50  15   Alk Phosphatase 39 - 117 U/L 57  56  51   Total Bilirubin 0.2 -  1.2 mg/dL 0.7  0.6  0.5       Current Medications:      Current Outpatient Medications (Analgesics):    Adalimumab (HUMIRA PEN) 40 MG/0.4ML PNKT, 40mg    Current Outpatient Medications (Other):    Cholecalciferol (VITAMIN D) 50 MCG (2000 UT) CAPS, Take 1 capsule by mouth daily.   Na Sulfate-K Sulfate-Mg Sulf 17.5-3.13-1.6 GM/177ML SOLN, Take 1 kit by mouth once for 1 dose.   saccharomyces boulardii (FLORASTOR) 250 MG capsule, Take 1 capsule (250 mg total) by mouth 2 (two) times daily.   valACYclovir (VALTREX) 500 MG tablet, Take 1 tablet (500 mg total) by mouth 2 (two) times daily.  Medical History:  Past Medical History:  Diagnosis Date   Bartholin's cyst 03/30/2021   Blood in stool    Constipation    DDD (degenerative disc disease), lumbar 2017   L4-L5 minimal  retrolisthesis with shallow, noncompressive disc displacement or disc bulge.  L5-S1 degenerative disc disease broad-based left paracentral disc protrusion noted disc herniation with minimal caudal migration and associated peripheral annular tear with abutment of the ventral medial aspect of the proximal left descending S1 nerve root   Diarrhea    Encounter for IUD removal 03/30/2021   Epigastric pain 03/01/2021   GERD (gastroesophageal reflux disease)    Hemorrhoids    Hx of migraines    Levoscoliosis 2015   "mild"   Low grade squamous intraepithelial lesion (LGSIL) on cervical Pap smear 06/12/2021   Regurgitation of food 03/01/2021   Rheumatoid arthritis (HCC) 2016   positive ANA 1: 10240 and rheumatoid factor  160.   Allergies:  Allergies  Allergen Reactions   Bactrim Ds [Sulfamethoxazole-Trimethoprim] Anaphylaxis and Rash     Surgical History:  She  has a past surgical history that includes Cesarean section (2009, 2012); Fracture surgery; Lymphadenectomy; and Procedure: Left L5-S1 transforaminal epidural steroid injection with fluoroscopy (02/06/2016). Family History:  Her family history includes Arthritis in her father; Diabetes in her maternal grandmother; Fibromyalgia in her mother; Healthy in her brother; Heart attack (age of onset: 29) in her maternal grandmother; Inflammatory bowel disease in her mother; Rheum arthritis in her mother.  REVIEW OF SYSTEMS  : All other systems reviewed and negative except where noted in the History of Present Illness.  PHYSICAL EXAM: BP 108/74   Pulse 80   Ht 5\' 5"  (1.651 m)   Wt 165 lb (74.8 kg)   LMP 09/30/2023   SpO2 97%   BMI 27.46 kg/m  General Appearance: Well nourished, in no apparent distress. Head:   Normocephalic and atraumatic. Eyes:  sclerae anicteric,conjunctive pink  Respiratory: Respiratory effort normal, BS equal bilaterally without rales, rhonchi, wheezing. Cardio: RRR with no MRGs. Peripheral pulses intact.  Abdomen:  Soft,  Non-distended ,active bowel sounds. mild tenderness in the epigastrium, in the RUQ, and in the LLQ. Without guarding and Without rebound. No masses. Rectal: declines Musculoskeletal: Full ROM, Normal gait. Without edema. Skin:  Dry and intact without significant lesions or rashes Neuro: Alert and  oriented x4;  No focal deficits. Psych:  Cooperative. Normal mood and affect.    Doree Albee, PA-C 9:24 AM

## 2023-10-07 ENCOUNTER — Ambulatory Visit: Payer: BC Managed Care – PPO | Admitting: Physician Assistant

## 2023-10-07 ENCOUNTER — Encounter: Payer: Self-pay | Admitting: Physician Assistant

## 2023-10-07 ENCOUNTER — Other Ambulatory Visit (INDEPENDENT_AMBULATORY_CARE_PROVIDER_SITE_OTHER): Payer: BC Managed Care – PPO

## 2023-10-07 VITALS — BP 108/74 | HR 80 | Ht 65.0 in | Wt 165.0 lb

## 2023-10-07 DIAGNOSIS — R7989 Other specified abnormal findings of blood chemistry: Secondary | ICD-10-CM

## 2023-10-07 DIAGNOSIS — R1310 Dysphagia, unspecified: Secondary | ICD-10-CM

## 2023-10-07 DIAGNOSIS — R197 Diarrhea, unspecified: Secondary | ICD-10-CM

## 2023-10-07 DIAGNOSIS — Z8379 Family history of other diseases of the digestive system: Secondary | ICD-10-CM

## 2023-10-07 DIAGNOSIS — M069 Rheumatoid arthritis, unspecified: Secondary | ICD-10-CM

## 2023-10-07 DIAGNOSIS — D849 Immunodeficiency, unspecified: Secondary | ICD-10-CM

## 2023-10-07 DIAGNOSIS — N808 Other endometriosis: Secondary | ICD-10-CM

## 2023-10-07 DIAGNOSIS — K219 Gastro-esophageal reflux disease without esophagitis: Secondary | ICD-10-CM | POA: Diagnosis not present

## 2023-10-07 DIAGNOSIS — M3500 Sicca syndrome, unspecified: Secondary | ICD-10-CM

## 2023-10-07 DIAGNOSIS — R1319 Other dysphagia: Secondary | ICD-10-CM

## 2023-10-07 LAB — HEPATIC FUNCTION PANEL
ALT: 20 U/L (ref 0–35)
AST: 20 U/L (ref 0–37)
Albumin: 4.6 g/dL (ref 3.5–5.2)
Alkaline Phosphatase: 61 U/L (ref 39–117)
Bilirubin, Direct: 0.1 mg/dL (ref 0.0–0.3)
Total Bilirubin: 0.5 mg/dL (ref 0.2–1.2)
Total Protein: 8.2 g/dL (ref 6.0–8.3)

## 2023-10-07 LAB — BASIC METABOLIC PANEL
BUN: 11 mg/dL (ref 6–23)
CO2: 27 meq/L (ref 19–32)
Calcium: 9.6 mg/dL (ref 8.4–10.5)
Chloride: 102 meq/L (ref 96–112)
Creatinine, Ser: 0.75 mg/dL (ref 0.40–1.20)
GFR: 96.63 mL/min (ref >60.00)
Glucose, Bld: 87 mg/dL (ref 70–99)
Potassium: 3.7 meq/L (ref 3.5–5.1)
Sodium: 136 meq/L (ref 135–145)

## 2023-10-07 LAB — CBC WITH DIFFERENTIAL/PLATELET
Basophils Absolute: 0 10*3/uL (ref 0.0–0.1)
Basophils Relative: 0.8 % (ref 0.0–3.0)
Eosinophils Absolute: 0.1 10*3/uL (ref 0.0–0.7)
Eosinophils Relative: 4.3 % (ref 0.0–5.0)
HCT: 35.7 % — ABNORMAL LOW (ref 36.0–46.0)
Hemoglobin: 12.4 g/dL (ref 12.0–15.0)
Lymphocytes Relative: 40.7 % (ref 12.0–46.0)
Lymphs Abs: 1.3 10*3/uL (ref 0.7–4.0)
MCHC: 34.6 g/dL (ref 30.0–36.0)
MCV: 89.5 fL (ref 78.0–100.0)
Monocytes Absolute: 0.3 10*3/uL (ref 0.1–1.0)
Monocytes Relative: 8 % (ref 3.0–12.0)
Neutro Abs: 1.5 10*3/uL (ref 1.4–7.7)
Neutrophils Relative %: 46.2 % (ref 43.0–77.0)
Platelets: 286 10*3/uL (ref 150.0–400.0)
RBC: 3.99 Mil/uL (ref 3.87–5.11)
RDW: 12.5 % (ref 11.5–15.5)
WBC: 3.2 10*3/uL — ABNORMAL LOW (ref 4.0–10.5)

## 2023-10-07 LAB — GAMMA GT: GGT: 28 U/L (ref 7–51)

## 2023-10-07 LAB — HIGH SENSITIVITY CRP: CRP, High Sensitivity: 1.23 mg/L (ref 0.000–5.000)

## 2023-10-07 LAB — SEDIMENTATION RATE: Sed Rate: 7 mm/h (ref 0–20)

## 2023-10-07 MED ORDER — NA SULFATE-K SULFATE-MG SULF 17.5-3.13-1.6 GM/177ML PO SOLN: 1.0000 | Freq: Once | ORAL | 0 refills | Status: AC

## 2023-10-07 NOTE — Patient Instructions (Addendum)
Your provider has requested that you go to the basement level for lab work before leaving today. Press "B" on the elevator. The lab is located at the first door on the left as you exit the elevator.  Dysphagia precautions:  2. Begin meals with warm beverage 3. Eat smaller more frequent meals 4. Eat slowly, taking small bites and sips 5. Alternate solids and liquids 6. Avoid foods/liquids that increase acid production 7. Sit upright during and for 30+ minutes after meals to facilitate esophageal clearing 8. All meats should be chopped finely.   If something gets hung in your esophagus and will not come up or go down, proceed to the emergency room.    First do a trial off milk/lactose products if you use them.  Add fiber like benefiber or citracel once a day Increase activity Can do trial of IBGard which is over the counter for AB pain- Take 1-2 capsules once a day for maintence or twice a day during a flare Can send in an anti spasm medication, Bentyl, to take as needed  Toileting tips to help with your constipation - Drink at least 64-80 ounces of water/liquid per day. - Establish a time to try to move your bowels every day.  For many people, this is after a cup of coffee or after a meal such as breakfast. - Sit all of the way back on the toilet keeping your back fairly straight and while sitting up, try to rest the tops of your forearms on your upper thighs.   - Raising your feet with a step stool/squatty potty can be helpful to improve the angle that allows your stool to pass through the rectum. - Relax the rectum feeling it bulge toward the toilet water.  If you feel your rectum raising toward your body, you are contracting rather than relaxing. - Breathe in and slowly exhale. "Belly breath" by expanding your belly towards your belly button. Keep belly expanded as you gently direct pressure down and back to the anus.  A low pitched GRRR sound can assist with increasing intra-abdominal  pressure.  (Can also trying to blow on a pinwheel and make it move, this helps with the same belly breathing) - Repeat 3-4 times. If unsuccessful, contract the pelvic floor to restore normal tone and get off the toilet.  Avoid excessive straining. - To reduce excessive wiping by teaching your anus to normally contract, place hands on outer aspect of knees and resist knee movement outward.  Hold 5-10 second then place hands just inside of knees and resist inward movement of knees.  Hold 5 seconds.  Repeat a few times each way.  Go to the ER if unable to pass gas, severe AB pain, unable to hold down food, any shortness of breath of chest pain.    FODMAP stands for fermentable oligo-, di-, mono-saccharides and polyols (1). These are the scientific terms used to classify groups of carbs that are difficult for our body to digest and that are notorious for triggering digestive symptoms like bloating, gas, loose stools and stomach pain.   You can try low FODMAP diet  - start with eliminating just one column at a time that you feel may be a trigger for you. - the table at the very bottom contains foods that are low in FODMAPs   Sometimes trying to eliminate the FODMAP's from your diet is difficult or tricky, if you are stuggling with trying to do the elimination diet you can try an enzyme.  There is a food enzymes that you sprinkle in or on your food that helps break down the FODMAP. You can read more about the enzyme by going to this site: https://fodzyme.com/  You have been scheduled for an endoscopy and colonoscopy. Please follow the written instructions given to you at your visit today.  Please pick up your prep supplies at the pharmacy within the next 1-3 days.  If you use inhalers (even only as needed), please bring them with you on the day of your procedure.  DO NOT TAKE 7 DAYS PRIOR TO TEST- Trulicity (dulaglutide) Ozempic, Wegovy (semaglutide) Mounjaro (tirzepatide) Bydureon Bcise  (exanatide extended release)  DO NOT TAKE 1 DAY PRIOR TO YOUR TEST Rybelsus (semaglutide) Adlyxin (lixisenatide) Victoza (liraglutide) Byetta (exanatide) ___________________________________________________________________________   I appreciate the  opportunity to care for you  Thank You   [facility]

## 2023-10-08 NOTE — Progress Notes (Signed)
____________________________________________________________  Attending physician addendum:  Thank you for sending this case to me. I have reviewed the entire note and agree with the plan.  Let me know if you have any questions or concerns on her follow-up liver lab workup, as we should certainly keep track of this with the possibility of developing AIH.  Amada Jupiter, MD  ____________________________________________________________

## 2023-10-14 LAB — IGG: IgG (Immunoglobin G), Serum: 2349 mg/dL — ABNORMAL HIGH (ref 600–1640)

## 2023-10-14 LAB — MITOCHONDRIAL ANTIBODIES: Mitochondrial M2 Ab, IgG: <=20 U (ref <=20.0)

## 2023-10-14 LAB — TISSUE TRANSGLUTAMINASE, IGA: (tTG) Ab, IgA: 9.4 U/mL

## 2023-10-14 LAB — ANTI-SMOOTH MUSCLE ANTIBODY, IGG: Actin (Smooth Muscle) Antibody (IGG): <20 U (ref <20)

## 2023-10-14 LAB — IGA: Immunoglobulin A: 76 mg/dL (ref 47–310)

## 2023-10-30 NOTE — Telephone Encounter (Signed)
 PT is calling to find out the status of her procedure. She updated her insurance and needs to make sure it covers her. Please advise.

## 2023-10-31 ENCOUNTER — Encounter: Payer: Self-pay | Admitting: Gastroenterology

## 2023-10-31 ENCOUNTER — Ambulatory Visit: Payer: Managed Care, Other (non HMO) | Admitting: Gastroenterology

## 2023-10-31 VITALS — BP 118/83 | HR 87 | Temp 98.0°F | Resp 13 | Ht 65.0 in | Wt 165.0 lb

## 2023-10-31 DIAGNOSIS — M069 Rheumatoid arthritis, unspecified: Secondary | ICD-10-CM

## 2023-10-31 DIAGNOSIS — K449 Diaphragmatic hernia without obstruction or gangrene: Secondary | ICD-10-CM

## 2023-10-31 DIAGNOSIS — K2289 Other specified disease of esophagus: Secondary | ICD-10-CM

## 2023-10-31 DIAGNOSIS — R1310 Dysphagia, unspecified: Secondary | ICD-10-CM

## 2023-10-31 DIAGNOSIS — K529 Noninfective gastroenteritis and colitis, unspecified: Secondary | ICD-10-CM

## 2023-10-31 DIAGNOSIS — M3500 Sicca syndrome, unspecified: Secondary | ICD-10-CM

## 2023-10-31 DIAGNOSIS — K644 Residual hemorrhoidal skin tags: Secondary | ICD-10-CM

## 2023-10-31 DIAGNOSIS — R1319 Other dysphagia: Secondary | ICD-10-CM

## 2023-10-31 DIAGNOSIS — K219 Gastro-esophageal reflux disease without esophagitis: Secondary | ICD-10-CM | POA: Diagnosis not present

## 2023-10-31 DIAGNOSIS — K222 Esophageal obstruction: Secondary | ICD-10-CM | POA: Diagnosis not present

## 2023-10-31 MED ORDER — SODIUM CHLORIDE 0.9 % IV SOLN: 500.0000 mL | Freq: Once | INTRAVENOUS | Status: DC

## 2023-10-31 NOTE — Op Note (Signed)
 [facility] Patient Name: Crystal Hutchinson Procedure Date: 10/31/2023 2:42 PM MRN: [id number] Endoscopist: Victory L. Legrand , MD, [id number] Age: 43 Referring MD:  Date of Birth: 1981/05/30 Gender: Female Account #: [id number] Procedure:                Upper GI endoscopy Indications:              Esophageal dysphagia Medicines:                Monitored Anesthesia Care Procedure:                Pre-Anesthesia Assessment:                           - Prior to the procedure, a History and Physical                            was performed, and patient medications and                            allergies were reviewed. The patient's tolerance of                            previous anesthesia was also reviewed. The risks                            and benefits of the procedure and the sedation                            options and risks were discussed with the patient.                            All questions were answered, and informed consent                            was obtained. Prior Anticoagulants: The patient has                            taken no anticoagulant or antiplatelet agents. ASA                            Grade Assessment: II - A patient with mild systemic                            disease. After reviewing the risks and benefits,                            the patient was deemed in satisfactory condition to                            undergo the procedure.                           After obtaining informed consent, the endoscope was  passed under direct vision. Throughout the                            procedure, the patient's blood pressure, pulse, and                            oxygen saturations were monitored continuously. The                            Olympus Scope [id number] was introduced through the                            mouth, and advanced to the second part of duodenum.                            The upper GI endoscopy was  accomplished without                            difficulty. The patient tolerated the procedure                            well. Scope In: Scope Out: Findings:                 A small hiatal hernia was present. (1-2 cm, sliding)                           A moderate Schatzki ring was found at the                            gastroesophageal junction. A TTS dilator was passed                            through the scope. Dilation with a 15-16.5-18 mm                            balloon dilator was performed (inflated                            sequentially) to 18 mm. It was held at each                            diameter for 45-60 seconds, then passed to and fro                            through the EGJ at 18mm. The dilation site was                            examined and showed no change. Then multiple cold                            biopsies were taken to disrupt the ring.  Mild mucosal changes characterized by longitudinal                            markings were found in the lower third of the                            esophagus. Several biopsies were obtained in the                            proximal esophagus and in the distal esophagus with                            cold forceps for histology. (r/o changes of GERD or                            EoE)                           The stomach was normal.                           The cardia and gastric fundus were normal on                            retroflexion.                           Normal mucosa was found in the entire duodenum.                            Biopsies for histology were taken with a cold                            forceps for evaluation of celiac disease. Complications:            No immediate complications. Estimated Blood Loss:     Estimated blood loss was minimal. Impression:               - Small hiatal hernia.                           - Moderate Schatzki ring. Balloon dilated without                             improvement, ring disrupted with cold Bx forceps.                           - Longitudinally marked mucosa in the esophagus.                            Nonspecific finding.                           - Normal stomach.                           -  Normal mucosa was found in the entire examined                            duodenum. Biopsied.                           - Several biopsies were obtained in the proximal                            esophagus and in the distal esophagus. Recommendation:           - Patient has a contact number available for                            emergencies. The signs and symptoms of potential                            delayed complications were discussed with the                            patient. Return to normal activities tomorrow.                            Written discharge instructions were provided to the                            patient.                           - Resume previous diet.                           - Continue present medications.                           - Await pathology results.                           - See the other procedure note for documentation of                            additional recommendations.                           - Clinic appointment with PA Victoria) will be                            arranged. Shanterica Biehler L. Legrand, MD 10/31/2023 3:52:56 PM This report has been signed electronically.

## 2023-10-31 NOTE — Progress Notes (Signed)
 Report to PACU, RN, vss, BBS= Clear.

## 2023-10-31 NOTE — Patient Instructions (Addendum)
 Recommendation:  - Patient has a contact number available for emergencies. The signs and symptoms of potential delayed complications were discussed with the patient. Return to normal activities tomorrow. Written discharge instructions were provided to the patient.  - Resume previous diet.  - Continue present medications.  - Await pathology results.  - Clinic appointment with PA Victoria) will be arranged. Dr. Clayburn office nurse will call you to schedule this appointment.   YOU HAD AN ENDOSCOPIC PROCEDURE TODAY AT THE [facility]:   Refer to the procedure report that was given to you for any specific questions about what was found during the examination.  If the procedure report does not answer your questions, please call your gastroenterologist to clarify.  If you requested that your care partner not be given the details of your procedure findings, then the procedure report has been included in a sealed envelope for you to review at your convenience later.  YOU SHOULD EXPECT: Some feelings of bloating in the abdomen. Passage of more gas than usual.  Walking can help get rid of the air that was put into your GI tract during the procedure and reduce the bloating. If you had a lower endoscopy (such as a colonoscopy or flexible sigmoidoscopy) you may notice spotting of blood in your stool or on the toilet paper. If you underwent a bowel prep for your procedure, you may not have a normal bowel movement for a few days.  Please Note:  You might notice some irritation and congestion in your nose or some drainage.  This is from the oxygen used during your procedure.  There is no need for concern and it should clear up in a day or so.  SYMPTOMS TO REPORT IMMEDIATELY:  Following lower endoscopy (colonoscopy or flexible sigmoidoscopy):  Excessive amounts of blood in the stool  Significant tenderness or worsening of abdominal pains  Swelling of the abdomen that is new, acute  Fever of 100F or  higher  Following upper endoscopy (EGD)  Vomiting of blood or coffee ground material  New chest pain or pain under the shoulder blades  Painful or persistently difficult swallowing  New shortness of breath  Fever of 100F or higher  Black, tarry-looking stools  For urgent or emergent issues, a gastroenterologist can be reached at any hour by calling [phone]. Do not use MyChart messaging for urgent concerns.    DIET:  We do recommend a small meal at first, but then you may proceed to your regular diet.  Drink plenty of fluids but you should avoid alcoholic beverages for 24 hours.  MEDICATIONS: Continue present medications.  FOLLOW UP:  Await pathology results.  Please see handouts given to you by your recovery nurse: Esophageal Stricture, Hiatal Hernia.  Thank you for allowing us  to provide for your healthcare needs today.  ACTIVITY:  You should plan to take it easy for the rest of today and you should NOT DRIVE or use heavy machinery until tomorrow (because of the sedation medicines used during the test).    FOLLOW UP: Our staff will call the number listed on your records the next business day following your procedure.  We will call around 7:15- 8:00 am to check on you and address any questions or concerns that you may have regarding the information given to you following your procedure. If we do not reach you, we will leave a message.     If any biopsies were taken you will be contacted by phone or by  letter within the next 1-3 weeks.  Please call us  at [phone] if you have not heard about the biopsies in 3 weeks.    SIGNATURES/CONFIDENTIALITY: You and/or your care partner have signed paperwork which will be entered into your electronic medical record.  These signatures attest to the fact that that the information above on your After Visit Summary has been reviewed and is understood.  Full responsibility of the confidentiality of this discharge information lies with you  and/or your care-partner.

## 2023-10-31 NOTE — Op Note (Signed)
 [facility] Patient Name: Crystal Hutchinson Procedure Date: 10/31/2023 2:43 PM MRN: [id number] Endoscopist: Victory L. Legrand , MD, [id number] Age: 43 Referring MD:  Date of Birth: [birth year] Gender: Female Account #: [id number] Procedure:                Colonoscopy Indications:              Chronic diarrhea                           clinical details in recent office consult note Medicines:                Monitored Anesthesia Care Procedure:                Pre-Anesthesia Assessment:                           - Prior to the procedure, a History and Physical                            was performed, and patient medications and                            allergies were reviewed. The patient's tolerance of                            previous anesthesia was also reviewed. The risks                            and benefits of the procedure and the sedation                            options and risks were discussed with the patient.                            All questions were answered, and informed consent                            was obtained. Prior Anticoagulants: The patient has                            taken no anticoagulant or antiplatelet agents. ASA                            Grade Assessment: II - A patient with mild systemic                            disease. After reviewing the risks and benefits,                            the patient was deemed in satisfactory condition to                            undergo the procedure.  After obtaining informed consent, the colonoscope                            was passed under direct vision. Throughout the                            procedure, the patient's blood pressure, pulse, and                            oxygen saturations were monitored continuously. The                            CF [id number] #[id number] was introduced through the anus                            and advanced to the the terminal ileum, with                             identification of the appendiceal orifice and IC                            valve. The colonoscopy was performed without                            difficulty. The patient tolerated the procedure                            well. The quality of the bowel preparation was                            excellent. The terminal ileum, ileocecal valve,                            appendiceal orifice, and rectum were photographed. Scope In: 3:04:55 PM Scope Out: 3:16:30 PM Scope Withdrawal Time: 0 hours 7 minutes 36 seconds  Total Procedure Duration: 0 hours 11 minutes 35 seconds  Findings:                 Skin tags were found on perianal exam. DRE normal.                           The terminal ileum appeared normal.                           Normal mucosa was found in the entire colon.                            Biopsies for histology were taken with a cold                            forceps from the right colon and left colon for                            evaluation of microscopic colitis.  The exam was otherwise without abnormality on                            direct and retroflexion views. Complications:            No immediate complications. Estimated Blood Loss:     Estimated blood loss was minimal. Impression:               - Perianal skin tags found on perianal exam.                           - The examined portion of the ileum was normal.                           - Normal mucosa in the entire examined colon.                            Biopsied.                           - The examination was otherwise normal on direct                            and retroflexion views. Recommendation:           - Patient has a contact number available for                            emergencies. The signs and symptoms of potential                            delayed complications were discussed with the                            patient. Return to normal  activities tomorrow.                            Written discharge instructions were provided to the                            patient.                           - Resume previous diet.                           - Continue present medications.                           - Await pathology results.                           - See the other procedure note for documentation of                            additional recommendations. [name] L. Legrand, MD 10/31/2023 3:44:09 PM This report has been signed electronically.

## 2023-10-31 NOTE — Progress Notes (Signed)
 Called to room to assist during endoscopic procedure.  Patient ID and intended procedure confirmed with present staff. Received instructions for my participation in the procedure from the performing physician.

## 2023-10-31 NOTE — Progress Notes (Signed)
 No significant changes to clinical history since GI office visit on 10/07/23.  The patient is appropriate for an endoscopic procedure in the ambulatory setting.  - Amada Jupiter, MD

## 2023-11-01 ENCOUNTER — Telehealth: Payer: Self-pay | Admitting: *Deleted

## 2023-11-01 NOTE — Telephone Encounter (Signed)
  Follow up Call-     10/31/2023    2:42 PM  Call back number  Post procedure Call Back phone  # [phone]  Permission to leave phone message Yes     Patient questions:  Do you have a fever, pain , or abdominal swelling? No. Pain Score  0 *  Have you tolerated food without any problems? Yes.    Have you been able to return to your normal activities? Yes.    Do you have any questions about your discharge instructions: Diet   No. Medications  No. Follow up visit  No.  Do you have questions or concerns about your Care? No.  Actions: * If pain score is 4 or above: No action needed, pain <4.

## 2023-11-05 LAB — SURGICAL PATHOLOGY

## 2023-11-06 ENCOUNTER — Encounter: Payer: Self-pay | Admitting: Gastroenterology

## 2023-11-07 DIAGNOSIS — K580 Irritable bowel syndrome with diarrhea: Secondary | ICD-10-CM

## 2023-11-22 MED ORDER — RIFAXIMIN 550 MG PO TABS: 550.0000 mg | ORAL_TABLET | Freq: Three times a day (TID) | ORAL | 0 refills | Status: AC

## 2024-01-05 ENCOUNTER — Other Ambulatory Visit: Payer: Self-pay | Admitting: Obstetrics and Gynecology

## 2024-01-05 DIAGNOSIS — B009 Herpesviral infection, unspecified: Secondary | ICD-10-CM

## 2024-01-20 ENCOUNTER — Encounter: Payer: Self-pay | Admitting: Physician Assistant

## 2024-01-20 ENCOUNTER — Ambulatory Visit: Payer: Managed Care, Other (non HMO) | Admitting: Physician Assistant

## 2024-01-20 VITALS — BP 118/72 | HR 67 | Ht 65.0 in | Wt 153.0 lb

## 2024-01-20 DIAGNOSIS — K219 Gastro-esophageal reflux disease without esophagitis: Secondary | ICD-10-CM | POA: Diagnosis not present

## 2024-01-20 DIAGNOSIS — R197 Diarrhea, unspecified: Secondary | ICD-10-CM | POA: Diagnosis not present

## 2024-01-20 DIAGNOSIS — R7989 Other specified abnormal findings of blood chemistry: Secondary | ICD-10-CM

## 2024-01-20 DIAGNOSIS — R1319 Other dysphagia: Secondary | ICD-10-CM

## 2024-01-20 DIAGNOSIS — R1310 Dysphagia, unspecified: Secondary | ICD-10-CM

## 2024-01-20 DIAGNOSIS — K580 Irritable bowel syndrome with diarrhea: Secondary | ICD-10-CM

## 2024-01-20 DIAGNOSIS — M069 Rheumatoid arthritis, unspecified: Secondary | ICD-10-CM

## 2024-01-20 DIAGNOSIS — N809 Endometriosis, unspecified: Secondary | ICD-10-CM

## 2024-01-20 MED ORDER — RIFAXIMIN 550 MG PO TABS: 550.0000 mg | ORAL_TABLET | Freq: Three times a day (TID) | ORAL | 0 refills | Status: AC

## 2024-01-20 NOTE — Patient Instructions (Addendum)
 Small intestinal bacterial overgrowth (SIBO) occurs when there is an abnormal increase in the overall bacterial population in the small intestine -- particularly types of bacteria not commonly found in that part of the digestive tract. Small intestinal bacterial overgrowth (SIBO) commonly results when a circumstance -- such as surgery or disease -- slows the passage of food and waste products in the digestive tract, creating a breeding ground for bacteria.  Signs and symptoms of SIBO often include: Loss of appetite Abdominal pain Nausea Bloating An uncomfortable feeling of fullness after eating Diarrhea or constipation, depending on the type of gas produced  What foods trigger SIBO? While foods aren't the original cause of SIBO, certain foods do encourage the overgrowth of the wrong bacteria in your small intestine. If you're feeding them their favorite foods, they're going to grow more, and that will trigger more of your SIBO symptoms. By the same token, you can help reduce the overgrowth by starving the problematic bacteria of their favorite foods. This strategy has led to a number of proposed SIBO eating plans. The plans vary, and so do individual results. But in general, they tend to recommend limiting carbohydrates.  These include: Sugars and sweeteners. Fruits and starchy vegetables. Dairy products. Grains.  There is a test for this we can do called a breath test, if you are positive we will treat you with an antibiotic to see if it helps.  Your symptoms are very suspicious for this condition, as discussed, we will start you on an antibiotic to see if this helps.   First do a trial off milk/lactose products if you use them.  Add fiber like benefiber or citracel once a day Increase activity Can do trial of IBGard which is over the counter for AB pain- Take 1-2 capsules once a day for maintence or twice a day during a flare    FODMAP stands for fermentable oligo-, di-,  mono-saccharides and polyols (1). These are the scientific terms used to classify groups of carbs that are difficult for our body to digest and that are notorious for triggering digestive symptoms like bloating, gas, loose stools and stomach pain.   You can try low FODMAP diet  - start with eliminating just one column at a time that you feel may be a trigger for you. - the table at the very bottom contains foods that are low in FODMAPs   Sometimes trying to eliminate the FODMAP's from your diet is difficult or tricky, if you are stuggling with trying to do the elimination diet you can try an enzyme.  There is a food enzymes that you sprinkle in or on your food that helps break down the FODMAP. You can read more about the enzyme by going to this site: https://fodzyme.com/    I appreciate the  opportunity to care for you  Thank You   [facility]

## 2024-01-20 NOTE — Progress Notes (Signed)
 01/20/2024 Crystal Hutchinson [id number] 03-19-1981  Referring provider: Natalia Leatherwood, DO Primary GI doctor: Dr. Myrtie Neither  ASSESSMENT AND PLAN:  Diarrhea with family history of crohn's  2-5 Bm's a day, can have some constipation prior to menses but otherwise loose, does have gas 10/31/2023 colonoscopy normal TI, skin tags perianal exam normal mucosa.  Pathology unremarkable Celiac negative, C. difficile, fecal fat and stool cultures negative Will plan on Xifaxan trial for IBS-D Can do trial of IBGARD daily Add on citracel/benefiber FODMAP,  and lifestyle changes discussed History of endometriosis, possibly contributing to symptoms as well, follow up GYN  GERD with dysphagia s/p dilation 10/31/2023 EGD showed small hiatal hernia moderate Schatzki ring dilated unremarkable stomach unremarkable duodenum biopsied.   Pathology showed negative celiac negative EOE Has not happened since dilation, symptoms have improved, if returns will plan on barium swallow Continue GERD diet and monitoring  Elevated liver function Slight elevation of antismooth muscle at 23 10/07/2023 IgG 2349 otherwise LFTs resolved and anti-smooth muscle antibody recheck was unremarkable. Monitor liver function and CBC every 6 months. If increase in LFTs, suggest AB [place]  Rheumatoid arthritis On humira, had 9 month without, restarted 07/2023 Well controlled  Endometriosis Could be contributing to bowel symptoms Consider pelvic floor/referral to GYN   Patient Care Team: Natalia Leatherwood, DO as PCP - General (Family Medicine) Warden Fillers, MD as Consulting Physician (Obstetrics and Gynecology) Rheumatology, [facility] (Rheumatology)  HISTORY OF PRESENT ILLNESS: 43 y.o. female with a past medical history of rheumatoid arthritis on Humira, Sjogren's, HSV and others listed below presents for evaluation of diarrhea with family history of Crohn's disease in her mother.  Patient was last seen in December for diarrhea  and set up for EGD colonoscopy with Dr. Myrtie Neither which was unremarkable, she continues to have diarrhea.   Discussed the use of AI scribe software for clinical note transcription with the patient, who gave verbal consent to proceed.  History of Present Illness   Crystal Hutchinson is a 43 year old female who presents with persistent diarrhea and swallowing difficulties.  She experiences persistent diarrhea occurring two to five times daily. Her bowel movements are typically loose, but she experiences constipation and painful cramping for two to three days before her menstrual period. During these episodes, she spends an extended time on the toilet and feels exhausted afterward, often needing to rest for an hour or two. Consuming a large amount of fruit leads to constipation followed by normal solid stools after about three days.  She has swallowing difficulties, which have improved since undergoing an endoscopic dilation of a ring in the esophagus. She experiences no significant issues unless she eats quickly or consumes certain foods like pork and chicken. She has modified her diet, eating smaller meals and avoiding these foods, which previously exacerbated her swallowing difficulties.  In December, she underwent an endoscopy and colonoscopy, which revealed a small hiatal hernia and a ring that was dilated. Biopsies were negative for celiac disease and eosinophilic esophagitis, and the colonoscopy was unremarkable. Despite these findings, her symptoms of diarrhea persist.  She has adjusted her eating habits to manage her symptoms, typically eating once a day to avoid frequent bathroom visits. She is not currently on any medication for reflux or heartburn.  No current issues with swallowing since the dilation procedure, except occasionally when eating quickly or consuming pork or chicken. No abdominal discomfort except for cramping before her menstrual period. She experiences significant gas.  She   reports that she quit smoking about 17 years ago. Her smoking use included cigarettes. She has never used smokeless tobacco. She reports current alcohol use of about 6.0 standard drinks of alcohol per week. She reports that she does not use drugs.  RELEVANT LABS AND IMAGING:  Results   LABS Liver function tests: Unremarkable Anti-smooth muscle antibody: Negative  DIAGNOSTIC EGD: Hiatal hernia, esophageal ring dilated (09/2023) Colonoscopy: Unremarkable (09/2023)  PATHOLOGY EGD biopsy: Negative for celiac disease and eosinophilic esophagitis (09/2023) Colonoscopy biopsy: Unremarkable (09/2023)      CBC    Component Value Date/Time   WBC 3.2 (L) 10/07/2023 0928   RBC 3.99 10/07/2023 0928   HGB 12.4 10/07/2023 0928   HCT 35.7 (L) 10/07/2023 0928   PLT 286.0 10/07/2023 0928   MCV 89.5 10/07/2023 0928   MCHC 34.6 10/07/2023 0928   RDW 12.5 10/07/2023 0928   LYMPHSABS 1.3 10/07/2023 0928   MONOABS 0.3 10/07/2023 0928   EOSABS 0.1 10/07/2023 0928   BASOSABS 0.0 10/07/2023 0928   Recent Labs    02/14/23 0803 10/07/23 0928  HGB 12.7 12.4    CMP     Component Value Date/Time   NA 136 10/07/2023 0928   NA 134 (A) 03/13/2021 0000   K 3.7 10/07/2023 0928   CL 102 10/07/2023 0928   CO2 27 10/07/2023 0928   GLUCOSE 87 10/07/2023 0928   BUN 11 10/07/2023 0928   BUN 7 03/13/2021 0000   CREATININE 0.75 10/07/2023 0928   CALCIUM 9.6 10/07/2023 0928   PROT 8.2 10/07/2023 0928   ALBUMIN 4.6 10/07/2023 0928   AST 20 10/07/2023 0928   ALT 20 10/07/2023 0928   ALKPHOS 61 10/07/2023 0928   BILITOT 0.5 10/07/2023 0928   GFRNONAA 109 03/13/2021 0000      Latest Ref Rng & Units 10/07/2023    9:28 AM 09/05/2023    8:45 AM 08/27/2023    9:25 AM  Hepatic Function  Total Protein 6.0 - 8.3 g/dL 8.2  8.4  8.1   Albumin 3.5 - 5.2 g/dL 4.6  4.9  4.6   AST 0 - 37 U/L 20  22  121   ALT 0 - 35 U/L 20  26  50   Alk Phosphatase 39 - 117 U/L 61  57  56   Total Bilirubin 0.2 - 1.2 mg/dL  0.5  0.7  0.6   Bilirubin, Direct 0.0 - 0.3 mg/dL 0.1         Current Medications:         Current Outpatient Medications (Analgesics):    Adalimumab (HUMIRA PEN) 40 MG/0.4ML PNKT, 40mg      Current Outpatient Medications (Other):    Cholecalciferol (VITAMIN D) 50 MCG (2000 UT) CAPS, Take 1 capsule by mouth daily.   rifaximin (XIFAXAN) 550 MG TABS tablet, Take 1 tablet (550 mg total) by mouth 3 (three) times daily for 14 days.   saccharomyces boulardii (FLORASTOR) 250 MG capsule, Take 1 capsule (250 mg total) by mouth 2 (two) times daily.   valACYclovir (VALTREX) 500 MG tablet, TAKE 1 TABLET(500 MG) BY MOUTH TWICE DAILY  Current Facility-Administered Medications (Other):    0.9 %  sodium chloride infusion  Medical History:  Past Medical History:  Diagnosis Date   Bartholin's cyst 03/30/2021   Blood in stool    Constipation    DDD (degenerative disc disease), lumbar 2017   L4-L5 minimal retrolisthesis with shallow, noncompressive disc displacement or disc bulge.  L5-S1 degenerative disc  disease broad-based left paracentral disc protrusion noted disc herniation with minimal caudal migration and associated peripheral annular tear with abutment of the ventral medial aspect of the proximal left descending S1 nerve root   Diarrhea    Encounter for IUD removal 03/30/2021   Epigastric pain 03/01/2021   GERD (gastroesophageal reflux disease)    Hemorrhoids    Hx of migraines    Levoscoliosis 2015   "mild"   Low grade squamous intraepithelial lesion (LGSIL) on cervical Pap smear 06/12/2021   Regurgitation of food 03/01/2021   Rheumatoid arthritis (HCC) 2016   positive ANA 1: 10240 and rheumatoid factor  160.   Allergies:  Allergies  Allergen Reactions   Bactrim Ds [Sulfamethoxazole-Trimethoprim] Anaphylaxis and Rash     Surgical History:  She  has a past surgical history that includes Cesarean section (2009, 2012); Fracture surgery; Lymphadenectomy; and Procedure: Left  L5-S1 transforaminal epidural steroid injection with fluoroscopy (02/06/2016). Family History:  Her family history includes Arthritis in her father; Diabetes in her maternal grandmother; Fibromyalgia in her mother; Healthy in her brother; Heart attack (age of onset: 27) in her maternal grandmother; Inflammatory bowel disease in her mother; Rheum arthritis in her mother.  REVIEW OF SYSTEMS  : All other systems reviewed and negative except where noted in the History of Present Illness.  PHYSICAL EXAM: BP 118/72   Pulse 67   Ht 5\' 5"  (1.651 m)   Wt 153 lb (69.4 kg)   BMI 25.46 kg/m  General Appearance: Well nourished, in no apparent distress. Head:   Normocephalic and atraumatic. Eyes:  sclerae anicteric,conjunctive pink  Respiratory: Respiratory effort normal, BS equal bilaterally without rales, rhonchi, wheezing. Cardio: RRR with no MRGs. Peripheral pulses intact.  Abdomen: Soft,  Non-distended ,active bowel sounds. mild tenderness diffuse Without guarding and Without rebound. No masses. Rectal: declines Musculoskeletal: Full ROM, Normal gait. Without edema. Skin:  Dry and intact without significant lesions or rashes Neuro: Alert and  oriented x4;  No focal deficits. Psych:  Cooperative. Normal mood and affect.    Doree Albee, PA-C 10:58 AM

## 2024-01-20 NOTE — Progress Notes (Signed)
 ____________________________________________________________  Attending physician addendum:  Thank you for sending this case to me. I have reviewed the entire note and agree with the plan.  I agree with a trial of empiric SIBO treatment given the symptoms in a patient with a rheumatologic disorder on immunosuppressive therapy.  Amada Jupiter, MD  ____________________________________________________________

## 2024-02-19 ENCOUNTER — Ambulatory Visit: Payer: BC Managed Care – PPO | Admitting: Family Medicine

## 2024-02-19 ENCOUNTER — Encounter: Payer: Self-pay | Admitting: Family Medicine

## 2024-02-19 VITALS — BP 126/80 | HR 70 | Temp 97.9°F | Ht 65.35 in | Wt 158.6 lb

## 2024-02-19 DIAGNOSIS — L989 Disorder of the skin and subcutaneous tissue, unspecified: Secondary | ICD-10-CM | POA: Diagnosis not present

## 2024-02-19 DIAGNOSIS — Z1231 Encounter for screening mammogram for malignant neoplasm of breast: Secondary | ICD-10-CM

## 2024-02-19 DIAGNOSIS — M3500 Sicca syndrome, unspecified: Secondary | ICD-10-CM | POA: Diagnosis not present

## 2024-02-19 DIAGNOSIS — Z131 Encounter for screening for diabetes mellitus: Secondary | ICD-10-CM | POA: Diagnosis not present

## 2024-02-19 DIAGNOSIS — Z13220 Encounter for screening for lipoid disorders: Secondary | ICD-10-CM | POA: Diagnosis not present

## 2024-02-19 DIAGNOSIS — Z0000 Encounter for general adult medical examination without abnormal findings: Secondary | ICD-10-CM | POA: Diagnosis not present

## 2024-02-19 DIAGNOSIS — M069 Rheumatoid arthritis, unspecified: Secondary | ICD-10-CM

## 2024-02-19 DIAGNOSIS — D849 Immunodeficiency, unspecified: Secondary | ICD-10-CM

## 2024-02-19 LAB — CBC
HCT: 36 % (ref 36.0–46.0)
Hemoglobin: 12.3 g/dL (ref 12.0–15.0)
MCHC: 34.2 g/dL (ref 30.0–36.0)
MCV: 91.8 fl (ref 78.0–100.0)
Platelets: 301 10*3/uL (ref 150.0–400.0)
RBC: 3.92 Mil/uL (ref 3.87–5.11)
RDW: 12.4 % (ref 11.5–15.5)
WBC: 4.3 10*3/uL (ref 4.0–10.5)

## 2024-02-19 LAB — COMPREHENSIVE METABOLIC PANEL WITH GFR
ALT: 23 U/L (ref 0–35)
AST: 20 U/L (ref 0–37)
Albumin: 4.5 g/dL (ref 3.5–5.2)
Alkaline Phosphatase: 63 U/L (ref 39–117)
BUN: 11 mg/dL (ref 6–23)
CO2: 26 meq/L (ref 19–32)
Calcium: 9.4 mg/dL (ref 8.4–10.5)
Chloride: 102 meq/L (ref 96–112)
Creatinine, Ser: 0.7 mg/dL (ref 0.40–1.20)
GFR: 104.7 mL/min (ref >60.00)
Glucose, Bld: 83 mg/dL (ref 70–99)
Potassium: 4.1 meq/L (ref 3.5–5.1)
Sodium: 135 meq/L (ref 135–145)
Total Bilirubin: 0.4 mg/dL (ref 0.2–1.2)
Total Protein: 7.8 g/dL (ref 6.0–8.3)

## 2024-02-19 LAB — LIPID PANEL
Cholesterol: 166 mg/dL (ref 0–200)
HDL: 65.8 mg/dL (ref >39.00)
LDL Cholesterol: 71 mg/dL (ref 0–99)
NonHDL: 99.93
Total CHOL/HDL Ratio: 3
Triglycerides: 147 mg/dL (ref 0.0–149.0)
VLDL: 29.4 mg/dL (ref 0.0–40.0)

## 2024-02-19 LAB — TSH: TSH: 1.34 u[IU]/mL (ref 0.35–5.50)

## 2024-02-19 LAB — HEMOGLOBIN A1C: Hgb A1c MFr Bld: 5.2 % (ref 4.6–6.5)

## 2024-02-19 MED ORDER — FLUCONAZOLE 150 MG PO TABS: 150.0000 mg | ORAL_TABLET | Freq: Once | ORAL | 0 refills | Status: AC

## 2024-02-19 NOTE — Progress Notes (Signed)
 Patient ID: Crystal Hutchinson, female  DOB: [birth year], 43 y.o.   MRN: [id number] Patient Care Team    Relationship Specialty Notifications Start End  Mariel Shope, DO PCP - General Family Medicine  02/13/21   Abigail Abler, MD Consulting Physician Obstetrics and Gynecology  02/12/22   Rheumatology, [facility]  Rheumatology  02/12/22     Chief Complaint  Patient presents with   Annual Exam   Subjective: Crystal Hutchinson is a 43 y.o.  Female  present for CPE. All past medical history, surgical history, allergies, family history, immunizations, medications and social history were updated in the electronic medical record today. All recent labs, ED visits and hospitalizations within the last year were reviewed.  Health maintenance:  Colonoscopy: no fhx.  Completed 10/31/2023, Dr. Camellia Caves, ?SIBO- rifaximin  Mammogram: no fhx. Completed 02/27/2022 [facility]> ordered 2024> ordered again> discussed yearly Cervical cancer screening: last pap: 09/28/2022 (ASCUS-neg HPV), completed by: GYN- [facility].  5-year Immunizations: tdap UTD 01/2022, Influenza UTD 2024 (encouraged yearly) Infectious disease screening: HIV completed, Hep C  screen completed DEXA: consider early screen with RA history- has rheum Patient has a Dental home. Hospitalizations/ED visits: Reviewed     02/19/2024    8:00 AM 08/27/2023    9:01 AM 02/14/2023    8:42 AM 09/28/2022   10:17 AM 11/28/2021    8:32 AM  Depression screen PHQ 2/9  Decreased Interest 2 1 0 1 0  Down, Depressed, Hopeless 1 1 0 0 0  PHQ - 2 Score 3 2 0 1 0  Altered sleeping 3 2  2    Tired, decreased energy 3 2  1    Change in appetite 0 0  0   Feeling bad or failure about yourself  0 1  0   Trouble concentrating 0 0  0   Moving slowly or fidgety/restless 0 0  0   Suicidal thoughts 0 0  0   PHQ-9 Score 9 7  4    Difficult doing work/chores Somewhat difficult Somewhat difficult         02/19/2024    8:00 AM 08/27/2023    9:02 AM 09/28/2022   10:17  AM 06/12/2021    2:42 PM  GAD 7 : Generalized Anxiety Score  Nervous, Anxious, on Edge  0 0 0  Control/stop worrying 0 0 0 0  Worry too much - different things 0 1 0 0  Trouble relaxing 0 0 0 0  Restless 0 0 1 0  Easily annoyed or irritable 1 1 1 1   Afraid - awful might happen 0 0 0 0  Total GAD 7 Score  2 2 1   Anxiety Difficulty Not difficult at all Not difficult at all       Immunization History  Administered Date(s) Administered   Influenza-Unspecified 08/22/2020, 08/22/2021, 07/22/2022, 07/23/2023   PFIZER(Purple Top)SARS-COV-2 Vaccination 01/28/2020, 02/19/2020   Tdap 02/12/2022    Past Medical History:  Diagnosis Date   Bartholin's cyst 03/30/2021   Blood in stool    Constipation    DDD (degenerative disc disease), lumbar 2017   L4-L5 minimal retrolisthesis with shallow, noncompressive disc displacement or disc bulge.  L5-S1 degenerative disc disease broad-based left paracentral disc protrusion noted disc herniation with minimal caudal migration and associated peripheral annular tear with abutment of the ventral medial aspect of the proximal left descending S1 nerve root   Diarrhea    Encounter for IUD removal 03/30/2021   Epigastric pain 03/01/2021   GERD (gastroesophageal reflux  disease)    Hemorrhoids    Hx of migraines    Levoscoliosis 2015   "mild"   Low grade squamous intraepithelial lesion (LGSIL) on cervical Pap smear 06/12/2021   Regurgitation of food 03/01/2021   Rheumatoid arthritis (HCC) 2016   positive ANA 1: 10240 and rheumatoid factor  160.   Allergies  Allergen Reactions   Bactrim Ds [Sulfamethoxazole-Trimethoprim] Anaphylaxis and Rash   Past Surgical History:  Procedure Laterality Date   CESAREAN SECTION  2009, 2012   X2   FRACTURE SURGERY     LYMPHADENECTOMY     Procedure: Left L5-S1 transforaminal epidural steroid injection with fluoroscopy  02/06/2016   Family History  Problem Relation Age of Onset   Rheum arthritis Mother         Significant   Fibromyalgia Mother    Inflammatory bowel disease Mother        Crohn's   Arthritis Father    Healthy Brother    Heart attack Maternal Grandmother 94   Diabetes Maternal Grandmother    Colon cancer Neg Hx    Colon polyps Neg Hx    Social History   Social History Narrative   Marital status/children/pets: single.    Education/employment: Employed, education and occupation questionnaire  not completed by pt.     Safety:      -Wears a bicycle helmet riding a bike: Yes     -smoke alarm in the home:Yes     - wears seatbelt: Yes     - Feels safe in their relationships: Yes    Allergies as of 02/19/2024       Reactions   Bactrim Ds [sulfamethoxazole-trimethoprim] Anaphylaxis, Rash        Medication List        Accurate as of February 19, 2024  8:18 AM. If you have any questions, ask your nurse or doctor.          fluconazole  150 MG tablet Commonly known as: DIFLUCAN  Take 1 tablet (150 mg total) by mouth once for 1 dose. Started by: Aliyyah Riese   Humira (2 Pen) 40 MG/0.4ML pen Generic drug: adalimumab 40mg    saccharomyces boulardii 250 MG capsule Commonly known as: Florastor Take 1 capsule (250 mg total) by mouth 2 (two) times daily.   valACYclovir  500 MG tablet Commonly known as: VALTREX  TAKE 1 TABLET(500 MG) BY MOUTH TWICE DAILY   Vitamin D 50 MCG (2000 UT) Caps Take 1 capsule by mouth daily.        All past medical history, surgical history, allergies, family history, immunizations andmedications were updated in the EMR today and reviewed under the history and medication portions of their EMR.      No results found.  ROS 14 pt review of systems performed and negative (unless mentioned in an HPI) Objective: BP 126/80   Pulse 70   Temp 97.9 F (36.6 C)   Ht 5' 5.35" (1.66 m)   Wt 158 lb 9.6 oz (71.9 kg)   SpO2 98%   BMI 26.11 kg/m  Physical Exam Vitals and nursing note reviewed.  Constitutional:      General: She is not in acute  distress.    Appearance: Normal appearance. She is not ill-appearing or toxic-appearing.  HENT:     Head: Normocephalic and atraumatic.     Right Ear: Tympanic membrane, ear canal and external ear normal. There is no impacted cerumen.     Left Ear: Tympanic membrane, ear canal and external ear normal. There is  no impacted cerumen.     Nose: No congestion or rhinorrhea.     Mouth/Throat:     Mouth: Mucous membranes are moist.     Pharynx: Oropharynx is clear. No oropharyngeal exudate or posterior oropharyngeal erythema.  Eyes:     General: No scleral icterus.       Right eye: No discharge.        Left eye: No discharge.     Extraocular Movements: Extraocular movements intact.     Conjunctiva/sclera: Conjunctivae normal.     Pupils: Pupils are equal, round, and reactive to light.  Cardiovascular:     Rate and Rhythm: Normal rate and regular rhythm.     Pulses: Normal pulses.     Heart sounds: Normal heart sounds. No murmur heard.    No friction rub. No gallop.  Pulmonary:     Effort: Pulmonary effort is normal. No respiratory distress.     Breath sounds: Normal breath sounds. No stridor. No wheezing, rhonchi or rales.  Chest:     Chest wall: No tenderness.  Abdominal:     General: Abdomen is flat. Bowel sounds are normal. There is no distension.     Palpations: Abdomen is soft. There is no mass.     Tenderness: There is no abdominal tenderness. There is no right CVA tenderness, left CVA tenderness, guarding or rebound.     Hernia: No hernia is present.  Musculoskeletal:        General: No swelling, tenderness or deformity. Normal range of motion.     Cervical back: Normal range of motion and neck supple. No rigidity or tenderness.     Right lower leg: No edema.     Left lower leg: No edema.  Lymphadenopathy:     Cervical: No cervical adenopathy.  Skin:    General: Skin is warm and dry.     Coloration: Skin is not jaundiced or pale.     Findings: Lesion (1 cm redish  /hyperpigemnted round solid raised-dome like lesion, tender,) present. No bruising, erythema or rash.  Neurological:     General: No focal deficit present.     Mental Status: She is alert and oriented to person, place, and time. Mental status is at baseline.     Cranial Nerves: No cranial nerve deficit.     Sensory: No sensory deficit.     Motor: No weakness.     Coordination: Coordination normal.     Gait: Gait normal.     Deep Tendon Reflexes: Reflexes normal.  Psychiatric:        Mood and Affect: Mood normal.        Behavior: Behavior normal.        Thought Content: Thought content normal.        Judgment: Judgment normal.      Assessment/plan: Melessa Slota is a 43 y.o. female present for CPE  Rheumatoid arthritis, involving unspecified site, unspecified whether rheumatoid factor present (HCC)/Sjogren's syndrome without extraglandular involvement (HCC) - prescribed humira by rheum  Skin lesion Derm rederral placed ? Dermofibroma. Present 10 yrs. No know injury. Changing in color and tender.  Routine general medical examination at a health care facility Patient was encouraged to exercise greater than 150 minutes a week. Patient was encouraged to choose a diet filled with fresh fruits and vegetables, and lean meats. AVS provided to patient today for education/recommendation on gender specific health and safety maintenance. Colonoscopy: no fhx.  Completed 10/31/2023, Dr. Camellia Caves, ?SIBO- rifaximin  Mammogram: no fhx. Completed  02/27/2022 [facility]> ordered 2024> ordered again> discussed yearly Cervical cancer screening: last pap: 09/28/2022 (ASCUS-neg HPV), completed by: GYN- [facility].  5-year Immunizations: tdap UTD 01/2022, Influenza UTD 2024 (encouraged yearly) Infectious disease screening: HIV completed, Hep C  screen completed DEXA: consider early screen with RA history- has rheum   Return in about 1 year (around 02/19/2025) for cpe (20 min).   Orders Placed This  Encounter  Procedures   MM 3D SCREENING MAMMOGRAM BILATERAL BREAST   CBC   Comprehensive metabolic panel with GFR   Hemoglobin A1c   TSH   Lipid panel   Ambulatory referral to Dermatology   Meds ordered this encounter  Medications   fluconazole  (DIFLUCAN ) 150 MG tablet    Sig: Take 1 tablet (150 mg total) by mouth once for 1 dose.    Dispense:  1 tablet    Refill:  0   Referral Orders         Ambulatory referral to Dermatology      Electronically signed by: Napolean Backbone, DO [facility]

## 2024-02-19 NOTE — Patient Instructions (Addendum)

## 2024-02-20 ENCOUNTER — Encounter: Payer: Self-pay | Admitting: Family Medicine

## 2024-03-18 ENCOUNTER — Encounter: Payer: Self-pay | Admitting: Family Medicine

## 2024-03-18 NOTE — Telephone Encounter (Signed)
 Please call pt and advise her I recommend she make an appt to evaluate further symptoms did not respond to diflucan 

## 2024-03-20 ENCOUNTER — Ambulatory Visit: Admitting: Urgent Care

## 2024-03-20 ENCOUNTER — Encounter: Payer: Self-pay | Admitting: Urgent Care

## 2024-03-20 VITALS — BP 106/71 | HR 73 | Temp 97.7°F | Wt 155.4 lb

## 2024-03-20 DIAGNOSIS — R300 Dysuria: Secondary | ICD-10-CM | POA: Diagnosis not present

## 2024-03-20 DIAGNOSIS — R102 Pelvic and perineal pain: Secondary | ICD-10-CM

## 2024-03-20 DIAGNOSIS — N898 Other specified noninflammatory disorders of vagina: Secondary | ICD-10-CM

## 2024-03-20 LAB — POCT URINALYSIS DIPSTICK
Bilirubin, UA: NEGATIVE
Blood, UA: NEGATIVE
Glucose, UA: NEGATIVE
Ketones, UA: NEGATIVE
Nitrite, UA: NEGATIVE
Protein, UA: NEGATIVE
Spec Grav, UA: 1.02 (ref 1.010–1.025)
Urobilinogen, UA: 0.2 U/dL
pH, UA: 6 (ref 5.0–8.0)

## 2024-03-20 MED ORDER — METRONIDAZOLE 500 MG PO TABS: 500.0000 mg | ORAL_TABLET | Freq: Two times a day (BID) | ORAL | 0 refills | Status: AC

## 2024-03-20 NOTE — Progress Notes (Signed)
 Established Patient Office Visit  Subjective:  Patient ID: Crystal Hutchinson, female    DOB: Jan 11, 1981  Age: 43 y.o. MRN: [id number]  Chief Complaint  Patient presents with   Vaginal Discharge    Onset a few weeks after abx    Pleasant 43yo female presents today with concerns of pelvic pressure, vaginal discharge and intermittent dysuria. States she was prescribed xifaxan  the end of March, and was supposed to take TID x 14 days. Pt admits she didn't take the abx as prescribed, and still has some left over, but feels that this caused a possible yeast infection. Had one tablet of diflucan  called in 02/19/24, which pt states seemed to help "some" but not resolve the issues. She states for the past month she has had pelvic pressure, intermittent vaginal discharge, and intermittent dysuria. States the discharge is white and thick with minimal itching. She denies hematuria, fever, flank pain.     Patient Active Problem List   Diagnosis Date Noted   Humira use-Immunocompromised (HCC) 12/19/2022   HSV (herpes simplex virus) infection 10/04/2022   Sjogren's disease (HCC) 02/12/2022   Rheumatoid arthritis (HCC) 02/12/2022   Past Medical History:  Diagnosis Date   Bartholin's cyst 03/30/2021   Blood in stool    Constipation    DDD (degenerative disc disease), lumbar 2017   L4-L5 minimal retrolisthesis with shallow, noncompressive disc displacement or disc bulge.  L5-S1 degenerative disc disease broad-based left paracentral disc protrusion noted disc herniation with minimal caudal migration and associated peripheral annular tear with abutment of the ventral medial aspect of the proximal left descending S1 nerve root   Diarrhea    Encounter for IUD removal 03/30/2021   Epigastric pain 03/01/2021   GERD (gastroesophageal reflux disease)    Hemorrhoids    Hx of migraines    Levoscoliosis 2015   "mild"   Low grade squamous intraepithelial lesion (LGSIL) on cervical Pap smear 06/12/2021    Regurgitation of food 03/01/2021   Rheumatoid arthritis (HCC) 2016   positive ANA 1: 10240 and rheumatoid factor  160.   Past Surgical History:  Procedure Laterality Date   CESAREAN SECTION  2009, 2012   X2   FRACTURE SURGERY     LYMPHADENECTOMY     Procedure: Left L5-S1 transforaminal epidural steroid injection with fluoroscopy  02/06/2016   Social History   Tobacco Use   Smoking status: Former    Current packs/day: 0.00    Types: Cigarettes    Quit date: 2008    Years since quitting: 17.4   Smokeless tobacco: Never  Vaping Use   Vaping status: Never Used  Substance Use Topics   Alcohol use: Yes    Alcohol/week: 6.0 standard drinks of alcohol    Types: 6 Cans of beer per week   Drug use: Never      ROS: as noted in HPI  Objective:     BP 106/71   Pulse 73   Temp 97.7 F (36.5 C)   Wt 155 lb 6.4 oz (70.5 kg)   SpO2 98%   BMI 25.58 kg/m  BP Readings from Last 3 Encounters:  03/20/24 106/71  02/19/24 126/80  01/20/24 118/72   Wt Readings from Last 3 Encounters:  03/20/24 155 lb 6.4 oz (70.5 kg)  02/19/24 158 lb 9.6 oz (71.9 kg)  01/20/24 153 lb (69.4 kg)      Physical Exam Vitals and nursing note reviewed. Exam conducted with a chaperone present.  Constitutional:      General:  She is not in acute distress.    Appearance: Normal appearance. She is not ill-appearing, toxic-appearing or diaphoretic.  HENT:     Head: Normocephalic and atraumatic.  Cardiovascular:     Rate and Rhythm: Normal rate.  Pulmonary:     Effort: Pulmonary effort is normal. No respiratory distress.  Abdominal:     General: Abdomen is flat. There is no distension.     Palpations: Abdomen is soft. There is no mass.  Genitourinary:    Labia:        Right: No rash, tenderness, lesion or injury.        Left: No rash, tenderness, lesion or injury.      Urethra: No prolapse or urethral swelling.     Vagina: Vaginal discharge present. No erythema, tenderness or bleeding.      Cervix: Normal. No friability, erythema or cervical bleeding.     Uterus: Not deviated, not enlarged, not fixed and not tender.      Adnexa: Right adnexa normal and left adnexa normal.       Right: No mass, tenderness or fullness.         Left: No mass, tenderness or fullness.       Comments: Pt reported "Mild pressure" to uterus and adnexa without stated pain Lymphadenopathy:     Lower Body: No right inguinal adenopathy. No left inguinal adenopathy.  Neurological:     Mental Status: She is alert.      Results for orders placed or performed in visit on 03/20/24  POCT urinalysis dipstick  Result Value Ref Range   Color, UA yellow    Clarity, UA cloudy    Glucose, UA Negative Negative   Bilirubin, UA negative    Ketones, UA negative    Spec Grav, UA 1.020 1.010 - 1.025   Blood, UA negative    pH, UA 6.0 5.0 - 8.0   Protein, UA Negative Negative   Urobilinogen, UA 0.2 0.2 or 1.0 E.U./dL   Nitrite, UA negative    Leukocytes, UA Large (3+) (A) Negative   Appearance     Odor      Last CBC Lab Results  Component Value Date   WBC 4.3 02/19/2024   HGB 12.3 02/19/2024   HCT 36.0 02/19/2024   MCV 91.8 02/19/2024   RDW 12.4 02/19/2024   PLT 301.0 02/19/2024   Last metabolic panel Lab Results  Component Value Date   GLUCOSE 83 02/19/2024   NA 135 02/19/2024   K 4.1 02/19/2024   CL 102 02/19/2024   CO2 26 02/19/2024   BUN 11 02/19/2024   CREATININE 0.70 02/19/2024   GFR 104.70 02/19/2024   CALCIUM 9.4 02/19/2024   PROT 7.8 02/19/2024   ALBUMIN 4.5 02/19/2024   BILITOT 0.4 02/19/2024   ALKPHOS 63 02/19/2024   AST 20 02/19/2024   ALT 23 02/19/2024      The 10-year ASCVD risk score (Arnett DK, et al., 2019) is: 0.3%  Assessment & Plan:  Dysuria -     POCT urinalysis dipstick -     Urine Culture  Pelvic pressure in female -     POCT urinalysis dipstick -     NuSwab Vaginitis Plus (VG+)  Vaginal discharge -     NuSwab Vaginitis Plus (VG+) -     metroNIDAZOLE ;  Take 1 tablet (500 mg total) by mouth 2 (two) times daily for 7 days.  Dispense: 14 tablet; Refill: 0  UA dip shows 3+ leuks which may be  contamination from discharge. Will obtain urine culture prior to starting urinary abx. Nuswab obtained; will start empiric treatment for BV given presentation and findings.    No follow-ups on file.   Mandy Second, PA

## 2024-03-20 NOTE — Patient Instructions (Signed)
 I recommend you start treatment for suspected bacterial vaginosis. This is an overgrowth of a normal germ found in the vagina. This is not an STD.  I have called in an antibiotic called flagyl  or metronidazole . Please take this twice daily for the next week. This is the treatment for bacterial vaginosis.  Please drink plenty of water while on the antibiotic, and avoid alcohol.   We will call with the results of the vaginal swab and urine culture if a change in your treatment plan is indicated.

## 2024-03-21 LAB — URINE CULTURE
MICRO NUMBER:: 16520046
Result:: NO GROWTH
SPECIMEN QUALITY:: ADEQUATE

## 2024-03-23 ENCOUNTER — Ambulatory Visit: Payer: Self-pay | Admitting: Urgent Care

## 2024-03-23 LAB — NUSWAB VAGINITIS PLUS (VG+)
Candida albicans, NAA: NEGATIVE
Candida glabrata, NAA: NEGATIVE

## 2024-03-24 ENCOUNTER — Ambulatory Visit: Admitting: Family Medicine

## 2024-04-14 NOTE — Telephone Encounter (Signed)
 Patient was seen by another provider. It is now being forwarded to me? If she still having symptoms then she will need to follow-up with myself for further evaluation.

## 2024-06-30 ENCOUNTER — Encounter: Payer: Self-pay | Admitting: Dermatology

## 2024-06-30 ENCOUNTER — Ambulatory Visit (INDEPENDENT_AMBULATORY_CARE_PROVIDER_SITE_OTHER): Payer: Self-pay | Admitting: Dermatology

## 2024-06-30 DIAGNOSIS — D2361 Other benign neoplasm of skin of right upper limb, including shoulder: Secondary | ICD-10-CM

## 2024-06-30 DIAGNOSIS — D485 Neoplasm of uncertain behavior of skin: Secondary | ICD-10-CM

## 2024-06-30 DIAGNOSIS — D239 Other benign neoplasm of skin, unspecified: Secondary | ICD-10-CM

## 2024-06-30 NOTE — Progress Notes (Signed)
   New Patient Visit   Subjective  Crystal Hutchinson is a 43 y.o. female who presents for the following: growth  Pt has a growth on her left leg for years but it has gotten larger so she'd like it evaluated. She states that she has to avoid when shaving her leg to avoid cutting it.   The following portions of the chart were reviewed this encounter and updated as appropriate: medications, allergies, medical history  Review of Systems:  No other skin or systemic complaints except as noted in HPI or Assessment and Plan.  Objective  Well appearing patient in no apparent distress; mood and affect are within normal limits.  A focused examination was performed of the following areas: Left leg  Relevant exam findings are noted in the Assessment and Plan.  Left Lower Leg 5mm pink papule   Assessment & Plan   DERMATOFIBROMA- inferior lesion Exam: Firm pink/brown papulenodule with dimple sign. Treatment Plan: A dermatofibroma is a benign growth possibly related to trauma, such as an insect bite, cut from shaving, or inflamed acne-type bump.  Treatment options to remove include shave or excision with resulting scar and risk of recurrence.  Since benign-appearing and not bothersome, will observe for now.    NEOPLASM OF UNCERTAIN BEHAVIOR OF SKIN Left Lower Leg Skin / nail biopsy Type of biopsy: tangential   Informed consent: discussed and consent obtained   Timeout: patient name, date of birth, surgical site, and procedure verified   Procedure prep:  Patient was prepped and draped in usual sterile fashion Prep type:  Isopropyl alcohol Anesthesia: the lesion was anesthetized in a standard fashion   Anesthetic:  1% lidocaine w/ epinephrine 1-100,000 buffered w/ 8.4% NaHCO3 Instrument used: DermaBlade   Hemostasis achieved with: aluminum chloride   Outcome: patient tolerated procedure well   Post-procedure details: sterile dressing applied and wound care instructions given   Dressing type:  petrolatum and bandage    Specimen 1 - Surgical pathology Differential Diagnosis: R/O DF vs other   Check Margins: No DERMATOFIBROMA    Return if symptoms worsen or fail to improve.  I, Darice Smock, CMA, am acting as scribe for RUFUS CHRISTELLA HOLY, MD.   Documentation: I have reviewed the above documentation for accuracy and completeness, and I agree with the above.  RUFUS CHRISTELLA HOLY, MD

## 2024-06-30 NOTE — Patient Instructions (Signed)

## 2024-07-01 LAB — SURGICAL PATHOLOGY

## 2024-07-02 ENCOUNTER — Encounter: Payer: Self-pay | Admitting: Family Medicine

## 2024-07-02 ENCOUNTER — Ambulatory Visit: Payer: Self-pay | Admitting: Dermatology

## 2024-07-02 NOTE — Telephone Encounter (Signed)
 Form given to PCP for review & signature.

## 2024-09-21 ENCOUNTER — Encounter: Payer: Self-pay | Admitting: Family Medicine

## 2024-09-25 ENCOUNTER — Encounter: Payer: Self-pay | Admitting: Family Medicine

## 2024-09-25 ENCOUNTER — Ambulatory Visit: Admitting: Family Medicine

## 2024-09-25 VITALS — BP 120/80 | HR 78 | Temp 98.2°F | Wt 158.0 lb

## 2024-09-25 DIAGNOSIS — B9689 Other specified bacterial agents as the cause of diseases classified elsewhere: Secondary | ICD-10-CM

## 2024-09-25 DIAGNOSIS — R062 Wheezing: Secondary | ICD-10-CM

## 2024-09-25 DIAGNOSIS — R051 Acute cough: Secondary | ICD-10-CM

## 2024-09-25 LAB — POC COVID19 BINAXNOW: SARS Coronavirus 2 Ag: NEGATIVE

## 2024-09-25 LAB — POC INFLUENZA A&B (BINAX/QUICKVUE)
Influenza A, POC: NEGATIVE
Influenza B, POC: NEGATIVE

## 2024-09-25 MED ORDER — AMOXICILLIN-POT CLAVULANATE 875-125 MG PO TABS: 1.0000 | ORAL_TABLET | Freq: Two times a day (BID) | ORAL | 0 refills | Status: AC

## 2024-09-25 MED ORDER — METHYLPREDNISOLONE ACETATE 80 MG/ML IJ SUSP
80.0000 mg | Freq: Once | INTRAMUSCULAR | Status: AC
  Administered 2024-09-25: 80 mg via INTRAMUSCULAR

## 2024-09-25 MED ORDER — BENZONATATE 200 MG PO CAPS: 200.0000 mg | ORAL_CAPSULE | Freq: Three times a day (TID) | ORAL | 0 refills | Status: AC | PRN

## 2024-09-25 MED ORDER — FLUCONAZOLE 150 MG PO TABS: 150.0000 mg | ORAL_TABLET | Freq: Once | ORAL | 0 refills | Status: AC

## 2024-09-25 NOTE — Progress Notes (Signed)
 Crystal Hutchinson , May 22, 1981, 43 y.o., female MRN: [id number] Patient Care Team    Relationship Specialty Notifications Start End  Crystal Hutchinson LABOR, DO PCP - General Family Medicine  02/13/21   Crystal Hutchinson LABOR, MD Consulting Physician Obstetrics and Gynecology  02/12/22   Rheumatology, [facility]  Rheumatology  02/12/22     Chief Complaint  Patient presents with   Cough    On and off for over 2 weeks.  Cough, drainage, SOB.       Subjective: Crystal Hutchinson is a 43 y.o. Pt presents for an OV with complaints of cough of > 2 weeks duration.  Associated symptoms include nasal drainage and shortness of breath. (See ROS) Pt has tried Claritin to ease their symptoms.      02/19/2024    8:00 AM 08/27/2023    9:01 AM 02/14/2023    8:42 AM 09/28/2022   10:17 AM 11/28/2021    8:32 AM  Depression screen PHQ 2/9  Decreased Interest 2 1 0 1 0  Down, Depressed, Hopeless 1 1 0 0 0  PHQ - 2 Score 3 2 0 1 0  Altered sleeping 3 2  2    Tired, decreased energy 3 2  1    Change in appetite 0 0  0   Feeling bad or failure about yourself  0 1  0   Trouble concentrating 0 0  0   Moving slowly or fidgety/restless 0 0  0   Suicidal thoughts 0 0  0   PHQ-9 Score 9  7   4     Difficult doing work/chores Somewhat difficult Somewhat difficult        Data saved with a previous flowsheet row definition    Allergies  Allergen Reactions   Bactrim Ds [Sulfamethoxazole-Trimethoprim] Anaphylaxis and Rash   Social History   Social History Narrative   Marital status/children/pets: single.    Education/employment: Employed, education and occupation questionnaire  not completed by pt.     Safety:      -Wears a bicycle helmet riding a bike: Yes     -smoke alarm in the home:Yes     - wears seatbelt: Yes     - Feels safe in their relationships: Yes   Past Medical History:  Diagnosis Date   Bartholin's cyst 03/30/2021   Blood in stool    Constipation    DDD (degenerative disc disease), lumbar 2017   L4-L5  minimal retrolisthesis with shallow, noncompressive disc displacement or disc bulge.  L5-S1 degenerative disc disease broad-based left paracentral disc protrusion noted disc herniation with minimal caudal migration and associated peripheral annular tear with abutment of the ventral medial aspect of the proximal left descending S1 nerve root   Diarrhea    Encounter for IUD removal 03/30/2021   Epigastric pain 03/01/2021   GERD (gastroesophageal reflux disease)    Hemorrhoids    Hx of migraines    Levoscoliosis 2015   mild   Low grade squamous intraepithelial lesion (LGSIL) on cervical Pap smear 06/12/2021   Regurgitation of food 03/01/2021   Rheumatoid arthritis (HCC) 2016   positive ANA 1: 10240 and rheumatoid factor  160.   Past Surgical History:  Procedure Laterality Date   CESAREAN SECTION  2009, 2012   X2   FRACTURE SURGERY     LYMPHADENECTOMY     Procedure: Left L5-S1 transforaminal epidural steroid injection with fluoroscopy  02/06/2016   Family History  Problem Relation Age of Onset   Rheum  arthritis Mother        Significant   Fibromyalgia Mother    Inflammatory bowel disease Mother        Crohn's   Arthritis Father    Healthy Brother    Heart attack Maternal Grandmother 35   Diabetes Maternal Grandmother    Colon cancer Neg Hx    Colon polyps Neg Hx    Allergies as of 09/25/2024       Reactions   Bactrim Ds [sulfamethoxazole-trimethoprim] Anaphylaxis, Rash        Medication List        Accurate as of September 25, 2024  1:23 PM. If you have any questions, ask your nurse or doctor.          STOP taking these medications    saccharomyces boulardii 250 MG capsule Commonly known as: Florastor Stopped by: Crystal Hutchinson       TAKE these medications    amoxicillin -clavulanate 875-125 MG tablet Commonly known as: AUGMENTIN  Take 1 tablet by mouth 2 (two) times daily. Started by: Crystal Hutchinson   benzonatate  200 MG capsule Commonly known as:  TESSALON  Take 1 capsule (200 mg total) by mouth 3 (three) times daily as needed for cough. Started by: Crystal Hutchinson   fluconazole  150 MG tablet Commonly known as: DIFLUCAN  Take 1 tablet (150 mg total) by mouth once for 1 dose. Started by: Crystal Hutchinson   Humira (2 Pen) 40 MG/0.4ML pen Generic drug: adalimumab 40mg    valACYclovir  500 MG tablet Commonly known as: VALTREX  TAKE 1 TABLET(500 MG) BY MOUTH TWICE DAILY   Vitamin D 50 MCG (2000 UT) Caps Take 1 capsule by mouth daily.        All past medical history, surgical history, allergies, family history, immunizations andmedications were updated in the EMR today and reviewed under the history and medication portions of their EMR.     Review of Systems  Constitutional:  Positive for malaise/fatigue. Negative for chills and fever.  HENT:  Positive for congestion and sinus pain. Negative for ear discharge, ear pain, nosebleeds and sore throat.   Eyes: Negative.   Respiratory:  Positive for cough, sputum production, shortness of breath and wheezing.   Cardiovascular: Negative.   Gastrointestinal:  Negative for abdominal pain, diarrhea, nausea and vomiting.  Genitourinary: Negative.   Musculoskeletal: Negative.   Skin:  Negative for rash.  Neurological:  Negative for dizziness and headaches.  Psychiatric/Behavioral: Negative.     Negative, with the exception of above mentioned in HPI   Objective:  BP 120/80   Pulse 78   Temp 98.2 F (36.8 C)   Wt 158 lb (71.7 kg)   SpO2 98%   BMI 26.01 kg/m  Body mass index is 26.01 kg/m. Physical Exam Vitals and nursing note reviewed.  Constitutional:      General: She is not in acute distress.    Appearance: Normal appearance. She is normal weight. She is not ill-appearing or toxic-appearing.  HENT:     Head: Normocephalic and atraumatic.     Right Ear: Tympanic membrane, ear canal and external ear normal. Tympanic membrane is not erythematous.     Left Ear: Tympanic membrane,  ear canal and external ear normal. Tympanic membrane is not erythematous.     Nose: Congestion and rhinorrhea present. Rhinorrhea is clear.     Right Turbinates: Swollen.     Mouth/Throat:     Mouth: Mucous membranes are moist. No oral lesions.     Pharynx: Postnasal drip present. No  oropharyngeal exudate or posterior oropharyngeal erythema.  Eyes:     General: No scleral icterus.       Right eye: No discharge.        Left eye: No discharge.     Extraocular Movements: Extraocular movements intact.     Conjunctiva/sclera: Conjunctivae normal.     Pupils: Pupils are equal, round, and reactive to light.  Cardiovascular:     Rate and Rhythm: Normal rate and regular rhythm.     Heart sounds: No murmur heard. Pulmonary:     Effort: Pulmonary effort is normal. No respiratory distress.     Breath sounds: Rhonchi present. No wheezing or rales.  Musculoskeletal:     Cervical back: Neck supple. No tenderness.  Lymphadenopathy:     Cervical: No cervical adenopathy.  Skin:    Findings: No rash.  Neurological:     Mental Status: She is alert and oriented to person, place, and time. Mental status is at baseline.     Motor: No weakness.     Coordination: Coordination normal.     Gait: Gait normal.  Psychiatric:        Mood and Affect: Mood normal.        Behavior: Behavior normal.        Thought Content: Thought content normal.        Judgment: Judgment normal.    No results found. No results found. Results for orders placed or performed in visit on 09/25/24 (from the past 24 hours)  POC COVID-19 BinaxNow     Status: Normal   Collection Time: 09/25/24 11:27 AM  Result Value Ref Range   SARS Coronavirus 2 Ag Negative Negative  POC Influenza A&B (Binax test)     Status: Normal   Collection Time: 09/25/24 11:27 AM  Result Value Ref Range   Influenza A, POC Negative Negative   Influenza B, POC Negative Negative    Assessment/Plan: Crystal Hutchinson is a 43 y.o. female present for OV for   Acute cough/Bacterial respiratory infection (Primary) with wheezing - POC COVID-19 BinaxNow>neg - POC Influenza A&B (Binax test)> neg - methylPREDNISolone  acetate (DEPO-MEDROL ) injection 80 mg Right max sinusitis and mild bronchitis on exam today Augmentin  BID x 10 d (diflucan  prescribed if needed) Nasal saline flushes BID  Tessalon  perles for cough Rtc prn  Reviewed expectations re: course of current medical issues. Discussed self-management of symptoms. Outlined signs and symptoms indicating need for more acute intervention. Patient verbalized understanding and all questions were answered. Patient received an After-Visit Summary.    Orders Placed This Encounter  Procedures   POC COVID-19 BinaxNow   POC Influenza A&B (Binax test)   Meds ordered this encounter  Medications   amoxicillin -clavulanate (AUGMENTIN ) 875-125 MG tablet    Sig: Take 1 tablet by mouth 2 (two) times daily.    Dispense:  20 tablet    Refill:  0   fluconazole  (DIFLUCAN ) 150 MG tablet    Sig: Take 1 tablet (150 mg total) by mouth once for 1 dose.    Dispense:  1 tablet    Refill:  0   benzonatate  (TESSALON ) 200 MG capsule    Sig: Take 1 capsule (200 mg total) by mouth 3 (three) times daily as needed for cough.    Dispense:  30 capsule    Refill:  0   methylPREDNISolone  acetate (DEPO-MEDROL ) injection 80 mg   Referral Orders  No referral(s) requested today     Note is dictated utilizing voice recognition software. Although  note has been proof read prior to signing, occasional typographical errors still can be missed. If any questions arise, please do not hesitate to call for verification.   electronically signed by:  Crystal Bellini, DO  [facility] [place]

## 2024-10-07 ENCOUNTER — Ambulatory Visit: Admitting: Obstetrics and Gynecology

## 2024-11-26 ENCOUNTER — Ambulatory Visit: Admitting: Obstetrics and Gynecology

## 2025-02-19 ENCOUNTER — Encounter: Admitting: Family Medicine
